# Patient Record
Sex: Male | Born: 1950 | Race: Black or African American | Hispanic: No | Smoking: Current every day smoker
Health system: Southern US, Community
[De-identification: ages and names within clinical notes are randomized; demographics above are authoritative.]

## PROBLEM LIST (undated history)

## (undated) DIAGNOSIS — R0600 Dyspnea, unspecified: Secondary | ICD-10-CM

## (undated) DIAGNOSIS — J189 Pneumonia, unspecified organism: Secondary | ICD-10-CM

## (undated) DIAGNOSIS — J45909 Unspecified asthma, uncomplicated: Secondary | ICD-10-CM

## (undated) DIAGNOSIS — F1010 Alcohol abuse, uncomplicated: Secondary | ICD-10-CM

## (undated) DIAGNOSIS — J449 Chronic obstructive pulmonary disease, unspecified: Secondary | ICD-10-CM

## (undated) DIAGNOSIS — J302 Other seasonal allergic rhinitis: Secondary | ICD-10-CM

## (undated) DIAGNOSIS — R569 Unspecified convulsions: Secondary | ICD-10-CM

## (undated) HISTORY — PX: EYE SURGERY: SHX253

---

## 2005-07-17 ENCOUNTER — Ambulatory Visit: Payer: Self-pay | Admitting: Family Medicine

## 2005-07-29 ENCOUNTER — Ambulatory Visit: Payer: Self-pay | Admitting: Internal Medicine

## 2006-03-28 IMAGING — CT CT CHEST W/ CM
1 series · 15 of 33 positions shown, 19 images · non-contrast
Comparison: none

REASON FOR EXAM: ABN CXR 07-17-05 showed thickening of lung markings
COMMENTS:

[Series 2: soft tissue · axial · 0.62mm/px · z∈[-170,+135]mm · 15 of 73 slices shown, 19 images]
[im 6/73  mediastinal]
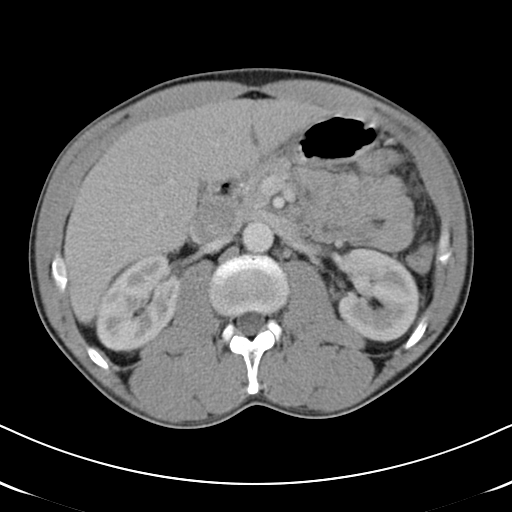
[im 6/73  lung]
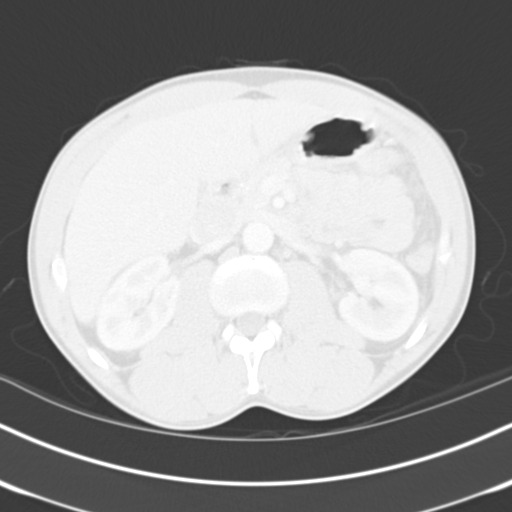
[im 11/73  lung]
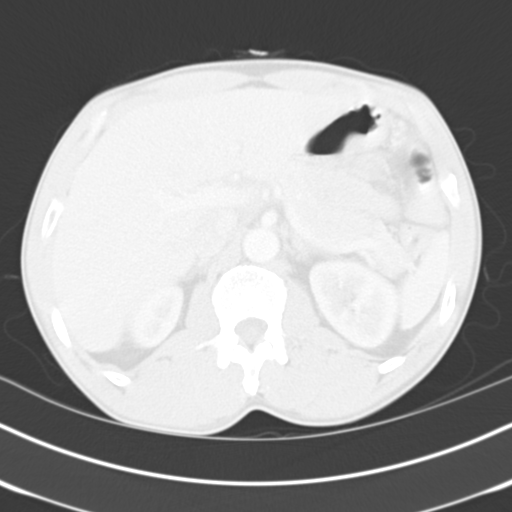
[im 15/73  lung]
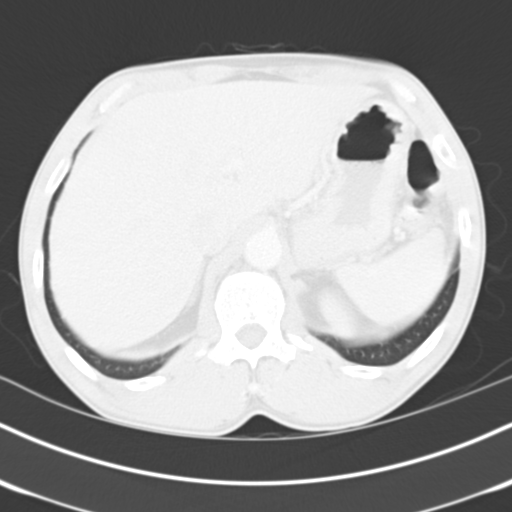
[im 19/73  lung]
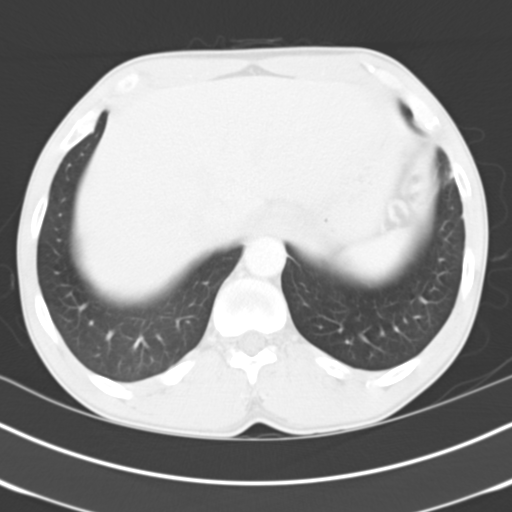
[im 25/73  mediastinal]
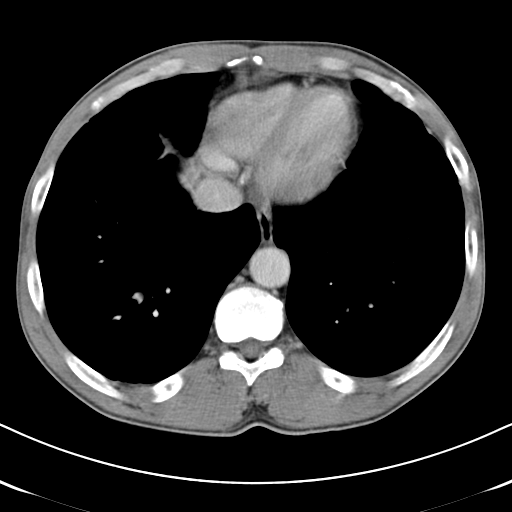
[im 25/73  lung]
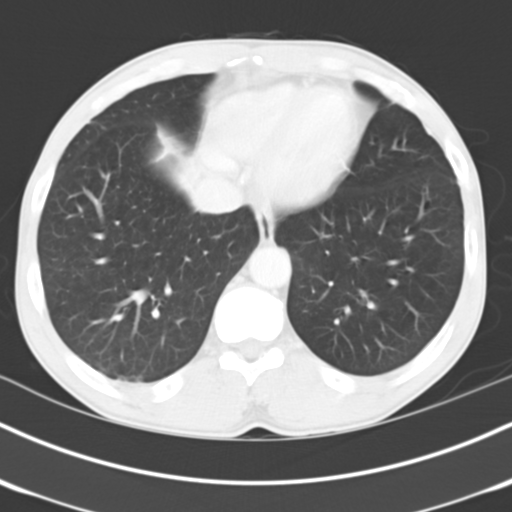
[im 29/73  lung]
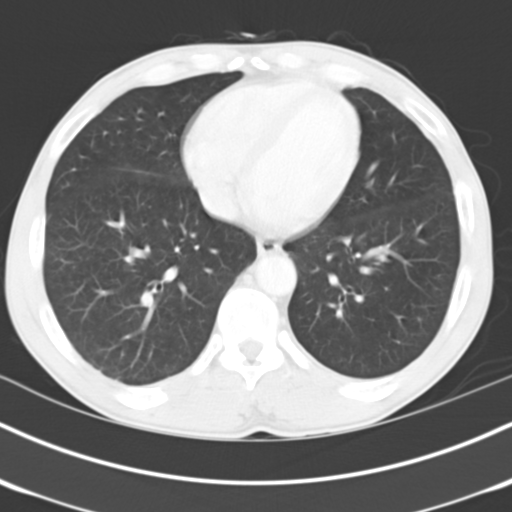
[im 33/73  lung]
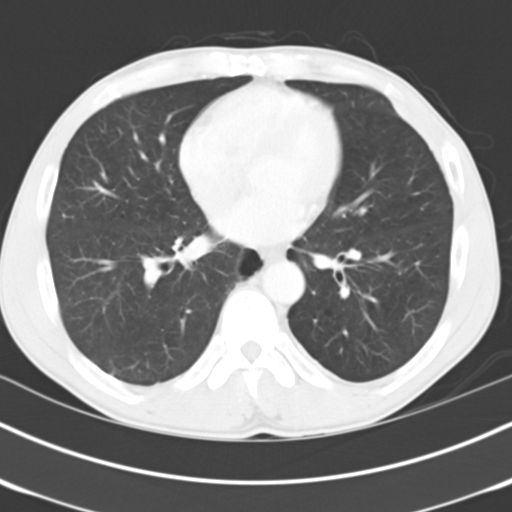
[im 38/73  lung]
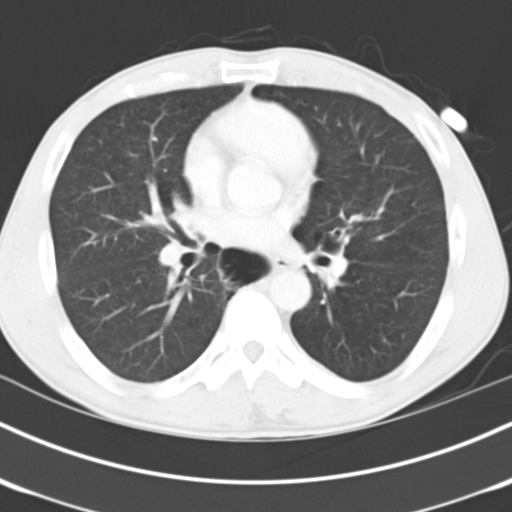
[im 41/73  mediastinal]
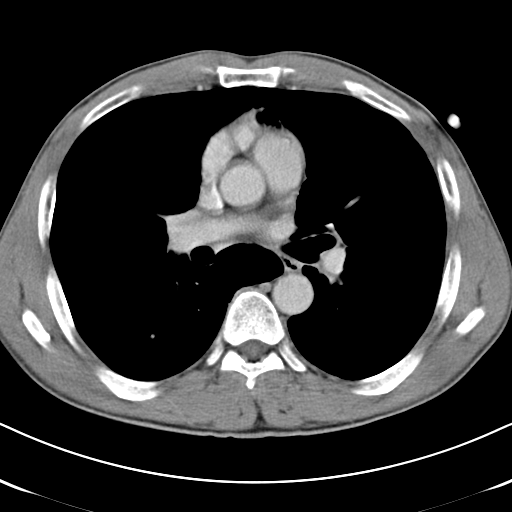
[im 41/73  lung]
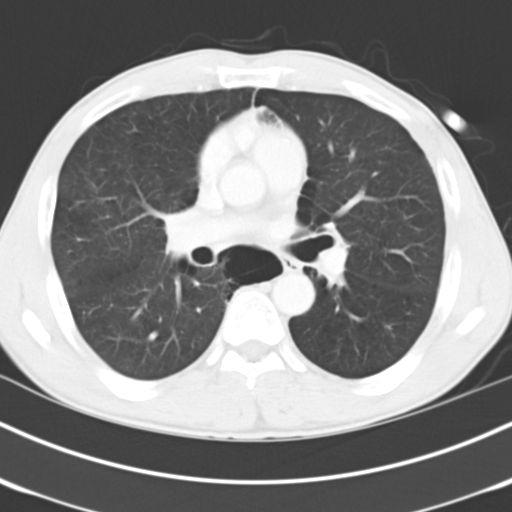
[im 44/73  lung]
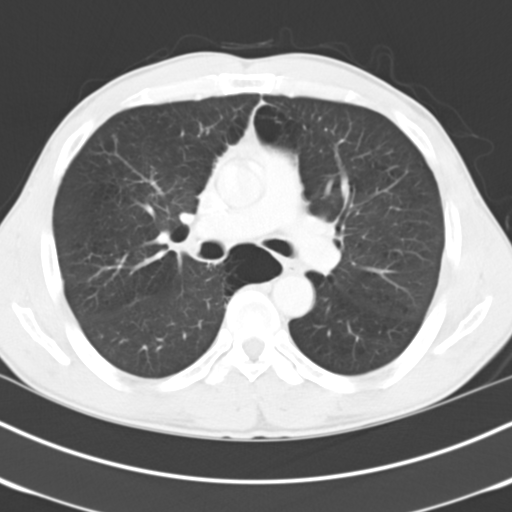
[im 49/73  lung]
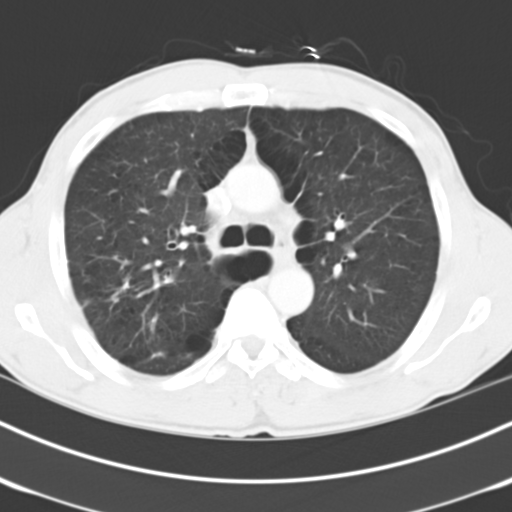
[im 54/73  lung]
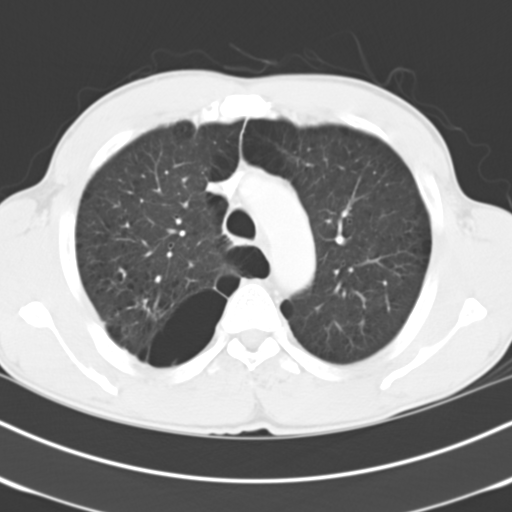
[im 58/73  mediastinal]
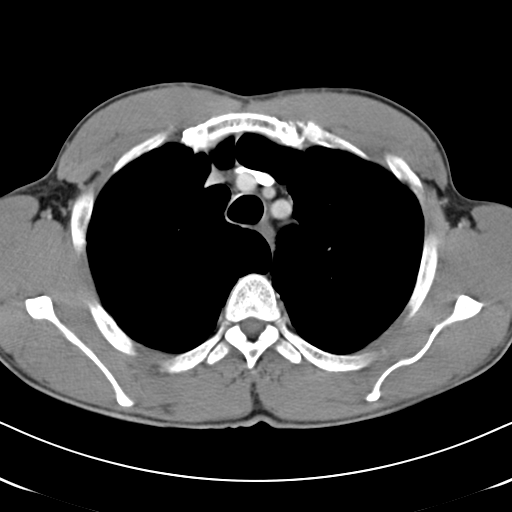
[im 58/73  lung]
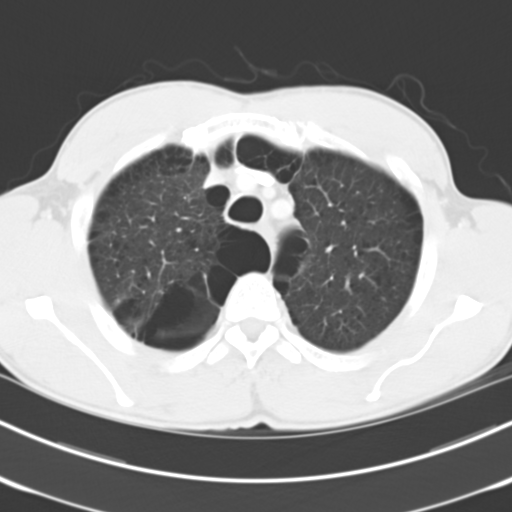
[im 62/73  lung]
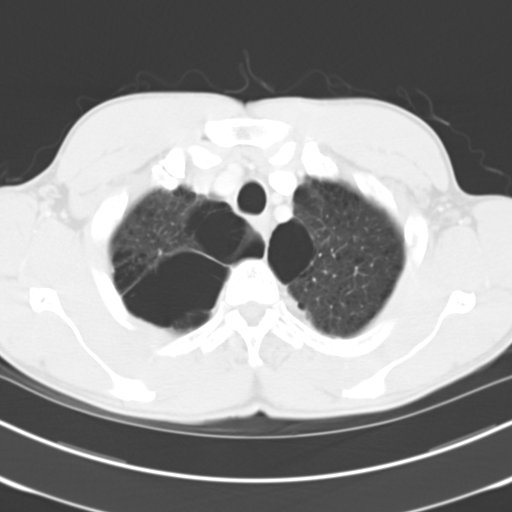
[im 67/73  lung]
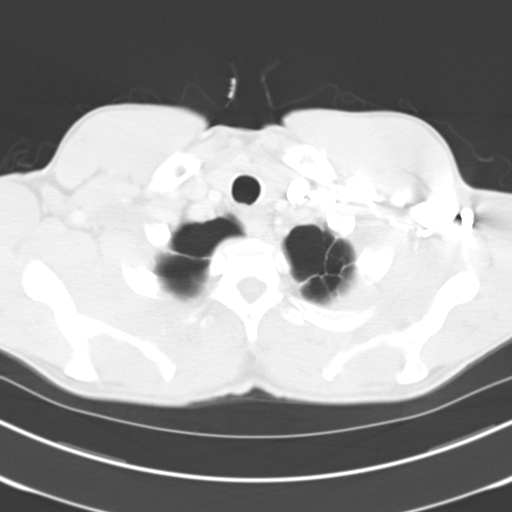

[15 of 33 positions shown; findings below may reference images not displayed]

PROCEDURE:     CT  - CT CHEST WITH CONTRAST  - July 29, 2005  [DATE]

RESULT:     5 mm helical cuts were performed through the chest with 50 ccs
of Isovue 370 contrast. A recent chest x-ray showed some thickening in the
RIGHT apex along with extensive underlying COPD.  Today's examination with
soft tissue windows shows nonspecific pleural thickening in the apices.
There is no suspicious axillary, mediastinal or perihilar mass or
adenopathy.  No pleural or pericardial effusions are present. Limited cuts
through the upper abdomen do not show an obvious solid organ abnormality.
The lung windows show extensive underlying COPD with emphysematous bullous
formation in the apices. There are some interstitial fibrotic changes in the
apex of the RIGHT lung, which I suspect represented the increased lung
markings. There is no obvious spiculated mass, nodule or pneumonia. The bony
structures show degenerative change.
IMPRESSION: 1)Severe underlying COPD with emphysematous bulla in the apices. There is
also evidence of interstitial fibrotic change particularly in the posterior
aspect of the RIGHT upper lobe. I suspect these interstitial changes are
responsible for the increased lung markings noted on a recent chest x-ray.
There is no evidence of a suspicious spiculated mass, nodule or pneumonia.

2)No suspicious hilar, mediastinal or axillary adenopathy.

3)Extensive degenerative bony changes.

## 2007-07-30 ENCOUNTER — Other Ambulatory Visit: Payer: Self-pay

## 2007-07-30 ENCOUNTER — Emergency Department: Payer: Self-pay | Admitting: Emergency Medicine

## 2010-09-17 ENCOUNTER — Ambulatory Visit: Payer: Self-pay | Admitting: Ophthalmology

## 2014-05-01 ENCOUNTER — Emergency Department: Payer: Self-pay | Admitting: Emergency Medicine

## 2014-05-01 LAB — DRUG SCREEN, URINE

## 2014-05-01 LAB — URINALYSIS, COMPLETE
BILIRUBIN, UR: NEGATIVE
Bacteria: NONE SEEN
Glucose,UR: >=500 mg/dL (ref 0–75)
KETONE: NEGATIVE
Leukocyte Esterase: NEGATIVE
Nitrite: NEGATIVE
Ph: 6 (ref 4.5–8.0)
Protein: NEGATIVE
Specific Gravity: 1.003 (ref 1.003–1.030)
Squamous Epithelial: NONE SEEN
WBC UR: NONE SEEN /HPF (ref 0–5)

## 2014-05-01 LAB — COMPREHENSIVE METABOLIC PANEL
ALBUMIN: 3.5 g/dL (ref 3.4–5.0)
ALT: 22 U/L (ref 12–78)
ANION GAP: 11 (ref 7–16)
Alkaline Phosphatase: 107 U/L
BUN: 15 mg/dL (ref 7–18)
Bilirubin,Total: 0.6 mg/dL (ref 0.2–1.0)
CO2: 23 mmol/L (ref 21–32)
Calcium, Total: 8.9 mg/dL (ref 8.5–10.1)
Chloride: 98 mmol/L (ref 98–107)
Creatinine: 0.89 mg/dL (ref 0.60–1.30)
Glucose: 224 mg/dL — ABNORMAL HIGH (ref 65–99)
Osmolality: 272 (ref 275–301)
POTASSIUM: 4.2 mmol/L (ref 3.5–5.1)
SGOT(AST): 48 U/L — ABNORMAL HIGH (ref 15–37)
Sodium: 132 mmol/L — ABNORMAL LOW (ref 136–145)
Total Protein: 7.1 g/dL (ref 6.4–8.2)

## 2014-05-01 LAB — CBC
HCT: 43.3 % (ref 40.0–52.0)
HGB: 14.4 g/dL (ref 13.0–18.0)
MCH: 29.4 pg (ref 26.0–34.0)
MCHC: 33.3 g/dL (ref 32.0–36.0)
MCV: 88 fL (ref 80–100)
Platelet: 160 10*3/uL (ref 150–440)
RBC: 4.9 10*6/uL (ref 4.40–5.90)
RDW: 15.1 % — AB (ref 11.5–14.5)
WBC: 6.4 10*3/uL (ref 3.8–10.6)

## 2014-05-01 LAB — CK TOTAL AND CKMB (NOT AT ARMC)
CK, TOTAL: 296 U/L
CK-MB: 5.1 ng/mL — ABNORMAL HIGH (ref 0.5–3.6)

## 2014-05-01 LAB — PROTIME-INR
INR: 1
Prothrombin Time: 13.2 secs (ref 11.5–14.7)

## 2014-05-01 LAB — ETHANOL
Ethanol %: 0.115 % — ABNORMAL HIGH (ref 0.000–0.080)
Ethanol: 115 mg/dL

## 2014-05-01 LAB — TROPONIN I: Troponin-I: <0.02 ng/mL

## 2014-05-01 LAB — AMMONIA: AMMONIA, PLASMA: 26 umol/L (ref 11–32)

## 2014-05-01 LAB — PRO B NATRIURETIC PEPTIDE: B-Type Natriuretic Peptide: 135 pg/mL — ABNORMAL HIGH (ref 0–125)

## 2014-05-01 LAB — LIPASE, BLOOD: Lipase: 106 U/L (ref 73–393)

## 2014-10-25 ENCOUNTER — Emergency Department: Payer: Self-pay | Admitting: Emergency Medicine

## 2014-10-25 LAB — CBC
HCT: 46.7 % (ref 40.0–52.0)
HGB: 15.7 g/dL (ref 13.0–18.0)
MCH: 31.2 pg (ref 26.0–34.0)
MCHC: 33.6 g/dL (ref 32.0–36.0)
MCV: 93 fL (ref 80–100)
PLATELETS: 205 10*3/uL (ref 150–440)
RBC: 5.03 10*6/uL (ref 4.40–5.90)
RDW: 13 % (ref 11.5–14.5)
WBC: 7.8 10*3/uL (ref 3.8–10.6)

## 2014-10-25 LAB — URINALYSIS, COMPLETE
BACTERIA: NONE SEEN
BLOOD: NEGATIVE
Bilirubin,UR: NEGATIVE
Glucose,UR: NEGATIVE mg/dL (ref 0–75)
Ketone: NEGATIVE
Leukocyte Esterase: NEGATIVE
Nitrite: NEGATIVE
PH: 7 (ref 4.5–8.0)
Protein: 30
SPECIFIC GRAVITY: 1.015 (ref 1.003–1.030)
WBC UR: NONE SEEN /HPF (ref 0–5)

## 2014-10-25 LAB — BASIC METABOLIC PANEL
ANION GAP: 9 (ref 7–16)
BUN: 7 mg/dL (ref 7–18)
CHLORIDE: 100 mmol/L (ref 98–107)
CREATININE: 0.83 mg/dL (ref 0.60–1.30)
Calcium, Total: 9.3 mg/dL (ref 8.5–10.1)
Co2: 30 mmol/L (ref 21–32)
EGFR (Non-African Amer.): >60
Glucose: 78 mg/dL (ref 65–99)
Osmolality: 274 (ref 275–301)
Potassium: 3.9 mmol/L (ref 3.5–5.1)
Sodium: 139 mmol/L (ref 136–145)

## 2014-10-25 LAB — TROPONIN I

## 2015-09-17 ENCOUNTER — Encounter: Payer: Self-pay | Admitting: Emergency Medicine

## 2015-09-17 ENCOUNTER — Emergency Department
Admission: EM | Admit: 2015-09-17 | Discharge: 2015-09-17 | Disposition: A | Discharge status: Home / Self Care | Payer: BLUE CROSS/BLUE SHIELD | Attending: Student | Admitting: Student

## 2015-09-17 ENCOUNTER — Emergency Department: Payer: BLUE CROSS/BLUE SHIELD

## 2015-09-17 DIAGNOSIS — M25362 Other instability, left knee: Secondary | ICD-10-CM | POA: Insufficient documentation

## 2015-09-17 DIAGNOSIS — M25562 Pain in left knee: Secondary | ICD-10-CM | POA: Diagnosis present

## 2015-09-17 DIAGNOSIS — Z720 Tobacco use: Secondary | ICD-10-CM | POA: Insufficient documentation

## 2015-09-17 NOTE — ED Notes (Signed)
Patient reports "Left Leg will will give away while I am standing on and off" X1 year.

## 2015-09-17 NOTE — ED Notes (Signed)
States he has been having weakness to left knee states knee has been buckling  For 2 years

## 2015-09-17 NOTE — ED Notes (Signed)
Denies any injury but states left knee has been buckling under him. No swelling noted

## 2015-09-17 NOTE — ED Provider Notes (Signed)
[facility] Emergency Department Provider Note  ____________________________________________  Time seen: Approximately 1:35 PM  I have reviewed the triage vital signs and the nursing notes.   HISTORY  Chief Complaint Knee Problem    HPI Andre Rios is a 64 y.o. male who presents for evaluation of left knee weakness. Patient states his knees starts buckling under him and feels like he is given a fall off balance on and off for about a year. Patient denies any pain at this time denies any trauma just some weakness in his left knee.   History reviewed. No pertinent past medical history.  There are no active problems to display for this patient.   History reviewed. No pertinent past surgical history.  No current outpatient prescriptions on file.  Allergies Review of patient's allergies indicates no known allergies.  No family history on file.  Social History Social History  Substance Use Topics  . Smoking status: Current Every Day Smoker  . Smokeless tobacco: None  . Alcohol Use: Yes    Review of Systems Constitutional: No fever/chills Eyes: No visual changes. ENT: No sore throat. Cardiovascular: Denies chest pain. Respiratory: Denies shortness of breath. Gastrointestinal: No abdominal pain.  No nausea, no vomiting.  No diarrhea.  No constipation. Genitourinary: Negative for dysuria. Musculoskeletal: Left knee weakness Skin: Negative for rash. Neurological: Negative for headaches, focal weakness or numbness.  10-point ROS otherwise negative.  ____________________________________________   PHYSICAL EXAM:  VITAL SIGNS: ED Triage Vitals  Enc Vitals Group     BP 09/17/15 1321 168/90 mmHg     Pulse Rate 09/17/15 1321 104     Resp --      Temp 09/17/15 1321 98.3 F (36.8 C)     Temp Source 09/17/15 1321 Oral     SpO2 09/17/15 1321 96 %     Weight 09/17/15 1327 120 lb (54.432 kg)     Height 09/17/15 1327 5\' 7"  (1.702 m)     Head  Cir --      Peak Flow --      Pain Score --      Pain Loc --      Pain Edu? --      Excl. in [place]? --     Constitutional: Alert and oriented. Well appearing and in no acute distress. Cardiovascular: Normal rate, regular rhythm. Grossly normal heart sounds.  Good peripheral circulation. Respiratory: Normal respiratory effort.  No retractions. Lungs CTAB. Musculoskeletal: No lower extremity tenderness nor edema.  No joint effusions. Neurologic:  Normal speech and language. No gross focal neurologic deficits are appreciated. No gait instability. Skin:  Skin is warm, dry and intact. No rash noted. Psychiatric: Mood and affect are normal. Speech and behavior are normal.  ____________________________________________   LABS (all labs ordered are listed, but only abnormal results are displayed)  Labs Reviewed - No data to display  RADIOLOGY  No bony or structural changes.  ____________________________________________   PROCEDURES  Procedure(s) performed: None  Critical Care performed: No  ____________________________________________   INITIAL IMPRESSION / ASSESSMENT AND PLAN / ED COURSE  Pertinent labs & imaging results that were available during my care of the patient were reviewed by me and considered in my medical decision making (see chart for details).  Left knee weakness. Knee immobilizer provided patient to take over-the-counter ibuprofen as needed. Follow-up with PCP for possible MRI.  Patient voices no other emergency medical complaints at this time. ____________________________________________   FINAL CLINICAL IMPRESSION(S) / ED DIAGNOSES  Final diagnoses:  Knee instability, left      Arlyss Repress, PA-C 09/17/15 1442  Joanne Gavel, MD 09/17/15 2237

## 2016-07-30 ENCOUNTER — Emergency Department: Payer: Self-pay

## 2016-07-30 ENCOUNTER — Emergency Department
Admission: EM | Admit: 2016-07-30 | Discharge: 2016-07-30 | Disposition: A | Discharge status: Home / Self Care | Payer: Self-pay | Attending: Emergency Medicine | Admitting: Emergency Medicine

## 2016-07-30 DIAGNOSIS — F1010 Alcohol abuse, uncomplicated: Secondary | ICD-10-CM | POA: Insufficient documentation

## 2016-07-30 DIAGNOSIS — F17200 Nicotine dependence, unspecified, uncomplicated: Secondary | ICD-10-CM | POA: Insufficient documentation

## 2016-07-30 DIAGNOSIS — J449 Chronic obstructive pulmonary disease, unspecified: Secondary | ICD-10-CM

## 2016-07-30 DIAGNOSIS — J40 Bronchitis, not specified as acute or chronic: Secondary | ICD-10-CM | POA: Insufficient documentation

## 2016-07-30 DIAGNOSIS — J441 Chronic obstructive pulmonary disease with (acute) exacerbation: Secondary | ICD-10-CM | POA: Insufficient documentation

## 2016-07-30 HISTORY — DX: Chronic obstructive pulmonary disease, unspecified: J44.9

## 2016-07-30 LAB — COMPREHENSIVE METABOLIC PANEL
ALT: 75 U/L — ABNORMAL HIGH (ref 17–63)
ANION GAP: 16 — AB (ref 5–15)
AST: 180 U/L — ABNORMAL HIGH (ref 15–41)
Albumin: 4.3 g/dL (ref 3.5–5.0)
Alkaline Phosphatase: 101 U/L (ref 38–126)
BUN: 8 mg/dL (ref 6–20)
CALCIUM: 9.6 mg/dL (ref 8.9–10.3)
CHLORIDE: 104 mmol/L (ref 101–111)
CO2: 20 mmol/L — AB (ref 22–32)
Creatinine, Ser: 0.76 mg/dL (ref 0.61–1.24)
GFR calc non Af Amer: >60 mL/min (ref >60)
Glucose, Bld: 95 mg/dL (ref 65–99)
POTASSIUM: 4.1 mmol/L (ref 3.5–5.1)
SODIUM: 140 mmol/L (ref 135–145)
Total Bilirubin: 0.6 mg/dL (ref 0.3–1.2)
Total Protein: 7.7 g/dL (ref 6.5–8.1)

## 2016-07-30 LAB — CBC WITH DIFFERENTIAL/PLATELET
Basophils Absolute: 0 10*3/uL (ref 0–0.1)
Basophils Relative: 1 %
EOS ABS: 0 10*3/uL (ref 0–0.7)
Eosinophils Relative: 0 %
HEMATOCRIT: 41.5 % (ref 40.0–52.0)
Hemoglobin: 14.3 g/dL (ref 13.0–18.0)
LYMPHS ABS: 0.5 10*3/uL — AB (ref 1.0–3.6)
LYMPHS PCT: 8 %
MCH: 31.1 pg (ref 26.0–34.0)
MCHC: 34.4 g/dL (ref 32.0–36.0)
MCV: 90.3 fL (ref 80.0–100.0)
Monocytes Absolute: 0.4 10*3/uL (ref 0.2–1.0)
Monocytes Relative: 6 %
NEUTROS PCT: 85 %
Neutro Abs: 5.8 10*3/uL (ref 1.4–6.5)
Platelets: 123 10*3/uL — ABNORMAL LOW (ref 150–440)
RBC: 4.6 MIL/uL (ref 4.40–5.90)
RDW: 13.7 % (ref 11.5–14.5)
WBC: 6.8 10*3/uL (ref 3.8–10.6)

## 2016-07-30 LAB — TROPONIN I: Troponin I: <0.03 ng/mL (ref <0.03)

## 2016-07-30 LAB — ETHANOL: Alcohol, Ethyl (B): 93 mg/dL — ABNORMAL HIGH (ref <5)

## 2016-07-30 MED ORDER — PREDNISONE 20 MG PO TABS: 60.0000 mg | ORAL_TABLET | Freq: Every day | ORAL | 0 refills | Status: DC

## 2016-07-30 MED ORDER — SODIUM CHLORIDE 0.9 % IV BOLUS (SEPSIS)
500.0000 mL | Freq: Once | INTRAVENOUS | Status: AC
  Administered 2016-07-30: 500 mL via INTRAVENOUS

## 2016-07-30 MED ORDER — ALBUTEROL SULFATE (2.5 MG/3ML) 0.083% IN NEBU
5.0000 mg | INHALATION_SOLUTION | Freq: Once | RESPIRATORY_TRACT | Status: AC
  Administered 2016-07-30: 5 mg via RESPIRATORY_TRACT
  Filled 2016-07-30: qty 6

## 2016-07-30 MED ORDER — THIAMINE HCL 100 MG/ML IJ SOLN
100.0000 mg | Freq: Every day | INTRAMUSCULAR | Status: DC
Start: 2016-07-30 — End: 2016-07-30
  Administered 2016-07-30: 100 mg via INTRAVENOUS
  Filled 2016-07-30: qty 2

## 2016-07-30 MED ORDER — PREDNISONE 20 MG PO TABS
60.0000 mg | ORAL_TABLET | Freq: Once | ORAL | Status: AC
  Administered 2016-07-30: 60 mg via ORAL
  Filled 2016-07-30: qty 3

## 2016-07-30 MED ORDER — DOXYCYCLINE HYCLATE 100 MG PO CAPS: 100.0000 mg | ORAL_CAPSULE | Freq: Two times a day (BID) | ORAL | 0 refills | Status: DC

## 2016-07-30 MED ORDER — ALBUTEROL SULFATE HFA 108 (90 BASE) MCG/ACT IN AERS: 2.0000 | INHALATION_SPRAY | Freq: Four times a day (QID) | RESPIRATORY_TRACT | 2 refills | Status: DC | PRN

## 2016-07-30 MED ORDER — SODIUM CHLORIDE 0.9 % IV SOLN
1.0000 mg | Freq: Once | INTRAVENOUS | Status: AC
  Administered 2016-07-30: 1 mg via INTRAVENOUS
  Filled 2016-07-30: qty 0.2

## 2016-07-30 MED ORDER — LORAZEPAM 2 MG/ML IJ SOLN
1.0000 mg | Freq: Once | INTRAMUSCULAR | Status: AC
  Administered 2016-07-30: 1 mg via INTRAVENOUS
  Filled 2016-07-30: qty 1

## 2016-07-30 MED ORDER — IPRATROPIUM BROMIDE 0.02 % IN SOLN
0.5000 mg | Freq: Once | RESPIRATORY_TRACT | Status: AC
  Administered 2016-07-30: 0.5 mg via RESPIRATORY_TRACT
  Filled 2016-07-30: qty 2.5

## 2016-07-30 NOTE — ED Provider Notes (Addendum)
[facility] Emergency Department Provider Note  ____________________________________________   I have reviewed the triage vital signs and the nursing notes.   HISTORY  Chief Complaint Dizziness and Shortness of Breath    HPI Andre Rios is a 65 y.o. male presents today complaining of"a summer cold" patient states he's been coughing for last 3 days. Productive. Patient does have a history of tobacco abuse. He smokes. He is not on home oxygen. Patient also drinks every day. He did have alcohol last night although he states it maybe slightly less than normal. When he woke up this morning, he felt a little bit lightheaded. He did not have any "balance issues" aside from that. Denies any melena bright red blood per rectum or vomiting. He states that he does not have any focal numbness or weakness or headache. He states that when he coughs he feels a little bit lightheaded. He has not had a fever but he did feel somewhat warm he states. He denies history of alcohol withdrawal. He also complains of a slight sore throat and rhinorrhea      Past Medical History:  Diagnosis Date  . COPD (chronic obstructive pulmonary disease) (HCC)     There are no active problems to display for this patient.   Past Surgical History:  Procedure Laterality Date  . EYE SURGERY        Allergies Review of patient's allergies indicates no known allergies.  No family history on file.  Social History Social History  Substance Use Topics  . Smoking status: Current Every Day Smoker  . Smokeless tobacco: Never Used  . Alcohol use 3.0 oz/week    5 Cans of beer per week    Review of Systems }Constitutional: No fever/chills Eyes: No visual changes. ENT: Positive slight sore throat. No stiff neck no neck pain Cardiovascular: Positive cough Respiratory: Denies shortness of breath. Gastrointestinal:   no vomiting.  No diarrhea.  No constipation. Genitourinary: Negative for  dysuria. Musculoskeletal: Negative lower extremity swelling Skin: Negative for rash. Neurological: Negative for severe headaches, focal weakness or numbness. 10-point ROS otherwise negative.  ____________________________________________   PHYSICAL EXAM:  VITAL SIGNS: ED Triage Vitals [07/30/16 0830]  Enc Vitals Group     BP (!) 141/78     Pulse Rate (!) 107     Resp (!) 24     Temp 97.6 F (36.4 C)     Temp Source Oral     SpO2 99 %     Weight 123 lb (55.8 kg)     Height 5\' 6"  (1.676 m)     Head Circumference      Peak Flow      Pain Score 0     Pain Loc      Pain Edu?      Excl. in [place]?     Constitutional: Alert and oriented. Well appearing and in no acute distress. Eyes: Conjunctivae are normal. PERRL. EOMI. Head: Atraumatic. Nose: No congestion/rhinnorhea. Mouth/Throat: Mucous membranes are moist.  Oropharynx non-erythematous. Neck: No stridor.   Nontender with no meningismus Cardiovascular: Normal rate, regular rhythm. Grossly normal heart sounds.  Good peripheral circulation. Respiratory: Normal respiratory effort.  No retractions. Occasional slight wheeze noted. Abdominal: Soft and nontender. No distention. No guarding no rebound Back:  There is no focal tenderness or step off.  there is no midline tenderness there are no lesions noted. there is no CVA tenderness Musculoskeletal: No lower extremity tenderness, no upper extremity tenderness. No joint effusions,  no DVT signs strong distal pulses no edema Neurologic:  Cranial nerves II through XII are grossly intact 5 out of 5 strength bilateral upper and lower extremity. Finger to nose within normal limits heel to shin within normal limits, speech is normal with no word finding difficulty or dysarthria, reflexes symmetric, pupils are equally round and reactive to light, there is no pronator drift, sensation is normal, vision is intact to confrontation, gait is deferred, there is no nystagmus, normal neurologic exam Skin:   Skin is warm, dry and intact. No rash noted. Psychiatric: Mood and affect are normal. Speech and behavior are normal.  ____________________________________________   LABS (all labs ordered are listed, but only abnormal results are displayed)  Labs Reviewed  COMPREHENSIVE METABOLIC PANEL  ETHANOL  CBC WITH DIFFERENTIAL/PLATELET  TROPONIN I   ____________________________________________  EKG  I personally interpreted any EKGs ordered by me or triage Normal sinus rhythm rate 93 bpm, repolarization likely in the [place] leads, no acute ST elevation otherwise, normal axis ____________________________________________  RADIOLOGY  I reviewed any imaging ordered by me or triage that were performed during my shift and, if possible, patient and/or family made aware of any abnormal findings. ____________________________________________   PROCEDURES  Procedure(s) performed: None  Procedures  Critical Care performed: None  ____________________________________________   INITIAL IMPRESSION / ASSESSMENT AND PLAN / ED COURSE  Pertinent labs & imaging results that were available during my care of the patient were reviewed by me and considered in my medical decision making (see chart for details).  Patient complaining of a "summer cold" in the context of COPD and cough. We will give him thiamine and folate as he is a daily drinker given Ativan as a precaution given that he'll be here for some time and I do not wish to withdraw especially since he had less alcohol and normal last night, however he does not appear to be in withdrawal at this time. There is no evidence of CVA. His "balance issues" was a feeling of lightheadedness after coughing this morning. His neurologic exam is normal I do not think he requires a CT at this time. No evidence of CVA at this moment. Did not fall or hit his head. We will do chest x-ray basic blood work and reassess. I will give him a breathing treatment for  his slight wheeze.  Clinical Course   ______  ----------------------------------------- 12:11 PM on 07/30/2016 -----------------------------------------  Lungs are clear at this time patient feels much better. His "one beer" last night was enough to get an alcohol level of 93 this morning. He denies striking on call today. No evidence of this is acute ischemic process. There is no evidence of this is an acute coronary syndrome. Patient has URI symptoms with cough and wheeze. We will send him home on doxycycline, we've advised alcohol cessation which she is not interested in having any further intervention on today. His sats are good and he feels okay. Return precautions and follow-up given and understood. Patient not clinically intoxicated at this time.  ______________________________________   FINAL CLINICAL IMPRESSION(S) / ED DIAGNOSES  Final diagnoses:  None      This chart was dictated using voice recognition software.  Despite best efforts to proofread,  errors can occur which can change meaning.      Schuyler Amor, MD 07/30/16 [place], MD 07/30/16 [phone]

## 2016-07-30 NOTE — ED Triage Notes (Signed)
Pt c/o increased SOB over the past couple of day with cough and congestion.. Pt also c/o increased dizziness since seen here last time..states he is having trouble with his balance.. Pt was dropped off by his wife.

## 2016-08-06 ENCOUNTER — Emergency Department
Admission: EM | Admit: 2016-08-06 | Discharge: 2016-08-06 | Disposition: A | Discharge status: Home / Self Care | Payer: BLUE CROSS/BLUE SHIELD | Attending: Emergency Medicine | Admitting: Emergency Medicine

## 2016-08-06 ENCOUNTER — Encounter: Payer: Self-pay | Admitting: *Deleted

## 2016-08-06 DIAGNOSIS — T380X5A Adverse effect of glucocorticoids and synthetic analogues, initial encounter: Secondary | ICD-10-CM | POA: Insufficient documentation

## 2016-08-06 DIAGNOSIS — F19921 Other psychoactive substance use, unspecified with intoxication with delirium: Secondary | ICD-10-CM | POA: Insufficient documentation

## 2016-08-06 DIAGNOSIS — T50905A Adverse effect of unspecified drugs, medicaments and biological substances, initial encounter: Secondary | ICD-10-CM

## 2016-08-06 DIAGNOSIS — F17200 Nicotine dependence, unspecified, uncomplicated: Secondary | ICD-10-CM | POA: Insufficient documentation

## 2016-08-06 DIAGNOSIS — J449 Chronic obstructive pulmonary disease, unspecified: Secondary | ICD-10-CM | POA: Insufficient documentation

## 2016-08-06 NOTE — ED Provider Notes (Signed)
[facility] Emergency Department Provider Note  ____________________________________________  Time seen: Approximately 4:49 PM  I have reviewed the triage vital signs and the nursing notes.   HISTORY  Chief Complaint Hallucinations    HPI Andre Rios is a 65 y.o. male who comes to the ED complaining of an episode earlier today where he felt that he was having visual hallucinations. He thought that there was a swarm of people outside of his room, maybe up to 150 people. He could hear them talking, saying things about needing to clean the walls in the windows. Currently he feels alert and oriented and denies any hallucinations. No SI HI. Ever had anything like this before. No history of schizophrenia or bipolar, does not take any sort of neuroleptics or anti- psychotics.  He has been taking prednisone and doxycycline for the last 2 days due to COPD exacerbation. He shows me the bottle, and he is taking 60 mg of prednisone daily yesterday and the day before. He has not taken it today yet. Otherwise eating and drinking normally and no other complaints. His breathing has improved over the last 48 hours.  No head trauma. No headache or vision changes or numbness tingling or weakness   Past Medical History:  Diagnosis Date  . COPD (chronic obstructive pulmonary disease) (HCC)      There are no active problems to display for this patient.    Past Surgical History:  Procedure Laterality Date  . EYE SURGERY       Prior to Admission medications   Medication Sig Start Date End Date Taking? Authorizing Provider  albuterol (PROVENTIL HFA;VENTOLIN HFA) 108 (90 Base) MCG/ACT inhaler Inhale 2 puffs into the lungs every 6 (six) hours as needed for wheezing or shortness of breath. 07/30/16   Schuyler Amor, MD  doxycycline (VIBRAMYCIN) 100 MG capsule Take 1 capsule (100 mg total) by mouth 2 (two) times daily. 07/30/16   Schuyler Amor, MD     Allergies Review of  patient's allergies indicates no known allergies.   History reviewed. No pertinent family history.  Social History Social History  Substance Use Topics  . Smoking status: Current Every Day Smoker  . Smokeless tobacco: Never Used  . Alcohol use 3.0 oz/week    5 Cans of beer per week    Review of Systems  Constitutional:   No fever or chills.  ENT:   No sore throat. No rhinorrhea. Cardiovascular:   No chest pain. Respiratory:   No dyspnea , Positive occasional nonproductive cough. Gastrointestinal:   Negative for abdominal pain, vomiting and diarrhea.  Genitourinary:   Negative for dysuria or difficulty urinating. Musculoskeletal:   Negative for focal pain or swelling Neurological:   Negative for headaches. 10-point ROS otherwise negative.  ____________________________________________   PHYSICAL EXAM:  VITAL SIGNS: ED Triage Vitals  Enc Vitals Group     BP 08/06/16 1349 132/86     Pulse Rate 08/06/16 1349 88     Resp 08/06/16 1349 18     Temp 08/06/16 1349 98.4 F (36.9 C)     Temp Source 08/06/16 1349 Oral     SpO2 08/06/16 1349 98 %     Weight 08/06/16 1350 123 lb (55.8 kg)     Height 08/06/16 1350 5\' 6"  (1.676 m)     Head Circumference --      Peak Flow --      Pain Score 08/06/16 1350 0     Pain Loc --  Pain Edu? --      Excl. in [place]? --     Vital signs reviewed, nursing assessments reviewed.   Constitutional:   Alert and oriented. Well appearing and in no distress. Eyes:   No scleral icterus. No conjunctival pallor. PERRL. EOMI.  No nystagmus. ENT   Head:   Normocephalic and atraumatic.   Nose:   No congestion/rhinnorhea. No septal hematoma   Mouth/Throat:   MMM, no pharyngeal erythema. No peritonsillar mass.    Neck:   No stridor. No SubQ emphysema. No meningismus. Hematological/Lymphatic/Immunilogical:   No cervical lymphadenopathy. Cardiovascular:   RRR. Symmetric bilateral radial and DP pulses.  No murmurs.  Respiratory:   Normal  respiratory effort without tachypnea nor retractions. Breath sounds are clear and equal bilaterally. No wheezes/rales/rhonchi. Gastrointestinal:   Soft and nontender. Non distended. There is no CVA tenderness.  No rebound, rigidity, or guarding. Genitourinary:   deferred Musculoskeletal:   Nontender with normal range of motion in all extremities. No joint effusions.  No lower extremity tenderness.  No edema. Neurologic:   Normal speech and language.  CN 2-10 normal. Motor grossly intact. No gross focal neurologic deficits are appreciated.  Skin:    Skin is warm, dry and intact. No rash noted.  No petechiae, purpura, or bullae.  ____________________________________________    LABS (pertinent positives/negatives) (all labs ordered are listed, but only abnormal results are displayed) Labs Reviewed - No data to display ____________________________________________   EKG    ____________________________________________    RADIOLOGY    ____________________________________________   PROCEDURES Procedures  ____________________________________________   INITIAL IMPRESSION / ASSESSMENT AND PLAN / ED COURSE  Pertinent labs & imaging results that were available during my care of the patient were reviewed by me and considered in my medical decision making (see chart for details).  Patient presents with an episode of predominantly visual hallucinations. This is in the setting of recently starting high-dose prednisone for COPD exacerbation. His exam is otherwise benign and reassuring. No other acute symptoms. Presentation is highly consistent with adverse drug reaction, medication induced delirium related to the prednisone. This appears to been brief and self-limited, and I expect things to return to normal and an remain under control going forward as long as he doesn't take anymore prednisone. I advised him to stop taking the prednisone and discard the remaining tablets. Discussed  options as far as the best way to discard medication. Low suspicion for meningitis encephalitis psychosis structural brain lesion or intracranial hypertension or sepsis. Patient's very well-appearing, calm and comfortable oriented and will be discharged with family who are present at the bedside with him.     Clinical Course   ____________________________________________   FINAL CLINICAL IMPRESSION(S) / ED DIAGNOSES  Final diagnoses:  Delirium, drug-induced ([place])  Medication side effect, initial encounter       Portions of this note were generated with dragon dictation software. Dictation errors may occur despite best attempts at proofreading.    Carrie Mew, MD 08/06/16 1655

## 2016-08-06 NOTE — ED Triage Notes (Signed)
Pt arrives via EMS from home with hallucinations, states he felt like people were in his room states 150 people at his door, states seeing a herse and they were coming for him, pt was seen in ED last week for dehydration and pneumonia, was given doxycycline and predisone which he has been taking since 8/21, at present pt awake and alert, denies any hallucinations

## 2016-08-07 ENCOUNTER — Emergency Department
Admission: EM | Admit: 2016-08-07 | Discharge: 2016-08-08 | Disposition: A | Discharge status: Home / Self Care | Payer: BLUE CROSS/BLUE SHIELD | Attending: Emergency Medicine | Admitting: Emergency Medicine

## 2016-08-07 ENCOUNTER — Encounter: Payer: Self-pay | Admitting: *Deleted

## 2016-08-07 DIAGNOSIS — F09 Unspecified mental disorder due to known physiological condition: Secondary | ICD-10-CM

## 2016-08-07 DIAGNOSIS — F1027 Alcohol dependence with alcohol-induced persisting dementia: Secondary | ICD-10-CM

## 2016-08-07 DIAGNOSIS — F1010 Alcohol abuse, uncomplicated: Secondary | ICD-10-CM

## 2016-08-07 DIAGNOSIS — Z79899 Other long term (current) drug therapy: Secondary | ICD-10-CM | POA: Insufficient documentation

## 2016-08-07 DIAGNOSIS — J449 Chronic obstructive pulmonary disease, unspecified: Secondary | ICD-10-CM | POA: Insufficient documentation

## 2016-08-07 DIAGNOSIS — F17200 Nicotine dependence, unspecified, uncomplicated: Secondary | ICD-10-CM | POA: Insufficient documentation

## 2016-08-07 DIAGNOSIS — R441 Visual hallucinations: Secondary | ICD-10-CM

## 2016-08-07 DIAGNOSIS — Z046 Encounter for general psychiatric examination, requested by authority: Secondary | ICD-10-CM

## 2016-08-07 DIAGNOSIS — F99 Mental disorder, not otherwise specified: Secondary | ICD-10-CM

## 2016-08-07 DIAGNOSIS — E876 Hypokalemia: Secondary | ICD-10-CM | POA: Insufficient documentation

## 2016-08-07 HISTORY — DX: Other seasonal allergic rhinitis: J30.2

## 2016-08-07 HISTORY — DX: Pneumonia, unspecified organism: J18.9

## 2016-08-07 LAB — COMPREHENSIVE METABOLIC PANEL
ALK PHOS: 101 U/L (ref 38–126)
ALT: 55 U/L (ref 17–63)
ANION GAP: 11 (ref 5–15)
AST: 73 U/L — ABNORMAL HIGH (ref 15–41)
Albumin: 4.2 g/dL (ref 3.5–5.0)
BUN: 23 mg/dL — AB (ref 6–20)
CO2: 26 mmol/L (ref 22–32)
Calcium: 10 mg/dL (ref 8.9–10.3)
Chloride: 103 mmol/L (ref 101–111)
Creatinine, Ser: 1 mg/dL (ref 0.61–1.24)
GFR calc non Af Amer: >60 mL/min (ref >60)
GLUCOSE: 151 mg/dL — AB (ref 65–99)
Potassium: 3 mmol/L — ABNORMAL LOW (ref 3.5–5.1)
SODIUM: 140 mmol/L (ref 135–145)
Total Bilirubin: 0.5 mg/dL (ref 0.3–1.2)
Total Protein: 7.7 g/dL (ref 6.5–8.1)

## 2016-08-07 LAB — URINALYSIS COMPLETE WITH MICROSCOPIC (ARMC ONLY)
BACTERIA UA: NONE SEEN
Bilirubin Urine: NEGATIVE
Glucose, UA: 50 mg/dL — AB
Hgb urine dipstick: NEGATIVE
KETONES UR: NEGATIVE mg/dL
Leukocytes, UA: NEGATIVE
Nitrite: NEGATIVE
PROTEIN: 100 mg/dL — AB
Specific Gravity, Urine: 1.021 (ref 1.005–1.030)
pH: 5 (ref 5.0–8.0)

## 2016-08-07 LAB — CBC WITH DIFFERENTIAL/PLATELET
Basophils Absolute: 0 10*3/uL (ref 0–0.1)
Basophils Relative: 0 %
Eosinophils Absolute: 0 10*3/uL (ref 0–0.7)
Eosinophils Relative: 0 %
HEMATOCRIT: 41.6 % (ref 40.0–52.0)
Hemoglobin: 14.2 g/dL (ref 13.0–18.0)
LYMPHS PCT: 17 %
Lymphs Abs: 1.1 10*3/uL (ref 1.0–3.6)
MCH: 31.2 pg (ref 26.0–34.0)
MCHC: 34.1 g/dL (ref 32.0–36.0)
MCV: 91.5 fL (ref 80.0–100.0)
MONO ABS: 0.9 10*3/uL (ref 0.2–1.0)
MONOS PCT: 14 %
NEUTROS ABS: 4.4 10*3/uL (ref 1.4–6.5)
Neutrophils Relative %: 69 %
Platelets: 137 10*3/uL — ABNORMAL LOW (ref 150–440)
RBC: 4.54 MIL/uL (ref 4.40–5.90)
RDW: 13.4 % (ref 11.5–14.5)
WBC: 6.4 10*3/uL (ref 3.8–10.6)

## 2016-08-07 LAB — ETHANOL: Alcohol, Ethyl (B): <5 mg/dL (ref <5)

## 2016-08-07 LAB — URINE DRUG SCREEN, QUALITATIVE (ARMC ONLY)
AMPHETAMINES, UR SCREEN: NOT DETECTED
BARBITURATES, UR SCREEN: NOT DETECTED
BENZODIAZEPINE, UR SCRN: NOT DETECTED
Cannabinoid 50 Ng, Ur ~~LOC~~: POSITIVE — AB
Cocaine Metabolite,Ur ~~LOC~~: NOT DETECTED
MDMA (Ecstasy)Ur Screen: NOT DETECTED
Methadone Scn, Ur: NOT DETECTED
OPIATE, UR SCREEN: NOT DETECTED
PHENCYCLIDINE (PCP) UR S: NOT DETECTED
Tricyclic, Ur Screen: NOT DETECTED

## 2016-08-07 LAB — POTASSIUM: Potassium: 3.2 mmol/L — ABNORMAL LOW (ref 3.5–5.1)

## 2016-08-07 MED ORDER — POTASSIUM CHLORIDE 10 MEQ/100ML IV SOLN
10.0000 meq | Freq: Once | INTRAVENOUS | Status: DC
  Filled 2016-08-07: qty 100

## 2016-08-07 MED ORDER — POTASSIUM CHLORIDE CRYS ER 20 MEQ PO TBCR
40.0000 meq | EXTENDED_RELEASE_TABLET | Freq: Once | ORAL | Status: AC
  Administered 2016-08-07: 40 meq via ORAL
  Filled 2016-08-07: qty 2

## 2016-08-07 MED ORDER — LORAZEPAM 1 MG PO TABS
1.0000 mg | ORAL_TABLET | Freq: Once | ORAL | Status: AC
  Administered 2016-08-07: 1 mg via ORAL
  Filled 2016-08-07: qty 1

## 2016-08-07 MED ORDER — POTASSIUM CHLORIDE CRYS ER 20 MEQ PO TBCR
10.0000 meq | EXTENDED_RELEASE_TABLET | Freq: Every day | ORAL | Status: DC
  Administered 2016-08-08: 10 meq via ORAL
  Filled 2016-08-07: qty 1

## 2016-08-07 NOTE — ED Notes (Signed)
Pt resting comfortably at this time.

## 2016-08-07 NOTE — ED Triage Notes (Signed)
Per [employer] PD, Patient is IVC for c/o visual hallucinations, "seeing priests." Patient denies SI/HI, states he hears some singing and "things going across the wall."

## 2016-08-07 NOTE — ED Notes (Signed)
Spoke to EDP regarding IV potassium.  States that pt can just do oral potassium at this time and recheck potassium level 1 hour after oral potassium given to patient.

## 2016-08-07 NOTE — ED Notes (Signed)
Patient noted sleeping in room. No complaints, stable, in no acute distress. Q15 minute rounds and monitoring via [employer] to continue.  

## 2016-08-07 NOTE — BH Assessment (Signed)
Tele Assessment Note   Andre Rios is an 65 y.o. male, African American who presents to [facility] ED male with a history of COPD and daily alcohol use who presents to the emergency Department IVC by his wife for hallucinations. Patient was seen here yesterday for similar complaints. Patient has recently been started on prednisone 3 days ago for COPD exacerbation and since yesterday started having visual hallucinations. He is seeing people come in his house from the walls, he is seeing a singing choir in his house and also priests in his living room. Yesterday he was here and was told to stop prednisone. Patient is very confused and unable to provide history at this time so I am not sure if he stopped the prednisone. His wife is not here with him. Patient has no h/o hallucinations. Patient states that he is seeing things in his room, like things crawling into the wall, but is able to distinguish between reality.  Also feels crawling sensation in his skin.  Patient primary concern is of worsening AVH since at or around 1-1.5 months, apparently since he has started taking medication and been treated for pneumonia. Patient states that meds. Made him hot, but is confusing stating that the meds have helped yet has hallucinations. Patient denies any prescription for anti-psychotic or mental health related medication.  Per [facility] note, he has been taking prednisone and doxycycline for the last 2 days due to COPD exacerbation. He shows me the bottle, and he is taking 60 mg of prednisone daily yesterday and the day before. He has not taken it today yet. Otherwise eating and drinking normally and no other complaints. His breathing has improved over the last 48 hours.           Collateral Info: Per MAR noted today, patient's wife on the phone and according to her patient has been having intermittent visual hallucinations over the course of the last 2 years. She reports that he is a heavy drinker and drinks about 10 beers a  day. She is unclear if these hallucinations are brought on by being drunk up or with drawl. She does not remember him having any seizures when he stops drinking. She reports that the visual hallucinations at this time started happening before the patient started on the prednisone. She reports that he has never seen a psychiatrist for details. She does endorse that for the last 2 years has been more forgetful, forgets to pay bills, more confused and has had memory issues. He does not carry a diagnosis of dementia as far she knows.  Patient denies current SI/HI or history of. Patient denies previous hx. Of psychotic symptoms. However, pt acknowledges current problems x 1-1.5 month with AVH, seeing objects, feeling skin crawl, and hearing voices, no command. Patient denies current or hx. Of violence/ HI. Patient denies hx. Of inpatient or outpatient psychiatric care. Patient acknowledges hx. Of alcohol, denies S.A. And states that drinks about 8 beers a day, but has not drank anything for x 3 weeks. URL showed no alcohol levels, but positive for Cannabis. Pt denies any use of marijuana or drugs.     Diagnosis: Unknown Substance Delirium Withdrawal  Past Medical History:  Past Medical History:  Diagnosis Date  . COPD (chronic obstructive pulmonary disease) ([place])   . Pneumonia   . Seasonal allergies     Past Surgical History:  Procedure Laterality Date  . EYE SURGERY      Family History: No family history on file.  Social History:  reports that he has been smoking.  He has never used smokeless tobacco. He reports that he drinks about 3.0 oz of alcohol per week . He reports that he does not use drugs.  Additional Social History:  Alcohol / Drug Use Pain Medications: SEE MAR Prescriptions: SEE MAR Over the Counter: SEE MAR History of alcohol / drug use?: Yes Longest period of sobriety (when/how long): per pt. has not drank in 3 weeks, or since he was last seen for pneumonia Withdrawal  Symptoms: Other (Comment)  CIWA: CIWA-Ar BP: 122/71 Pulse Rate: 93 COWS:    PATIENT STRENGTHS: (choose at least two) Active sense of humor Average or above average intelligence Capable of independent living  Allergies: No Known Allergies  Home Medications:  (Not in a hospital admission)  OB/GYN Status:  No LMP for male patient.  General Assessment Data Location of Assessment: [facility] ED TTS Assessment: In system Is this a Tele or Face-to-Face Assessment?: Face-to-Face Is this an Initial Assessment or a Re-assessment for this encounter?: Initial Assessment Marital status: Single Maiden name: n/a Is patient pregnant?: No Pregnancy Status: No Living Arrangements: Other (Comment) (stays with roommate) Can pt return to current living arrangement?: Yes Admission Status: Involuntary Is patient capable of signing voluntary admission?: Yes Referral Source: Other Insurance type: [employer]     Crisis Care Plan Living Arrangements: Other (Comment) (stays with roommate) Name of Psychiatrist: none Name of Therapist: none  Education Status Is patient currently in school?: No Current Grade: n/a Highest grade of school patient has completed: unspecified Name of school: n/a Contact person: none given'  Risk to self with the past 6 months Suicidal Ideation: No Has patient been a risk to self within the past 6 months prior to admission? : No Suicidal Intent: No Has patient had any suicidal intent within the past 6 months prior to admission? : No Is patient at risk for suicide?: No Suicidal Plan?: No Has patient had any suicidal plan within the past 6 months prior to admission? : No Access to Means: No What has been your use of drugs/alcohol within the last 12 months?: alcohol Previous Attempts/Gestures: No How many times?: 0 Other Self Harm Risks: none noted Triggers for Past Attempts: None known Intentional Self Injurious Behavior: None Family Suicide History: No Recent  stressful life event(s): Other (Comment) Persecutory voices/beliefs?: No Depression: No Substance abuse history and/or treatment for substance abuse?: Yes Suicide prevention information given to non-admitted patients: Not applicable  Risk to Others within the past 6 months Homicidal Ideation: No Does patient have any lifetime risk of violence toward others beyond the six months prior to admission? : No Thoughts of Harm to Others: No Current Homicidal Intent: No Current Homicidal Plan: No Access to Homicidal Means: No Identified Victim: none History of harm to others?: No Assessment of Violence: None Noted Violent Behavior Description: none Does patient have access to weapons?: No Criminal Charges Pending?: No Does patient have a court date: No Is patient on probation?: No  Psychosis Hallucinations: Olfactory, Visual Delusions: None noted, Unspecified  Mental Status Report Appearance/Hygiene: In scrubs Eye Contact: Good Motor Activity: Freedom of movement Speech: Unremarkable Level of Consciousness: Alert Mood: Apprehensive Affect: Apprehensive Anxiety Level: Minimal Thought Processes: Coherent, Relevant Judgement: Partial Orientation: Person, Place, Time, Situation, Appropriate for developmental age Obsessive Compulsive Thoughts/Behaviors: None  Cognitive Functioning Concentration: Normal Memory: Recent Intact, Remote Intact IQ: Average Insight: Poor Impulse Control: Poor Appetite: Fair Weight Loss: 5 Weight Gain: 0 Sleep: No Change  Total Hours of Sleep: 6 Vegetative Symptoms: None  ADLScreening [facility] Assessment Services) Patient's cognitive ability adequate to safely complete daily activities?: Yes Patient able to express need for assistance with ADLs?: Yes Independently performs ADLs?: Yes (appropriate for developmental age)  Prior Inpatient Therapy Prior Inpatient Therapy: No Prior Therapy Dates: n/a Prior Therapy Facilty/Provider(s): n/a Reason for  Treatment: n/a  Prior Outpatient Therapy Prior Outpatient Therapy: No Prior Therapy Dates: n/a Prior Therapy Facilty/Provider(s): n/a Reason for Treatment: n/a Does patient have an ACCT team?: No Does patient have Intensive In-House Services?  : No Does patient have [facility] services? : No Does patient have P4CC services?: No  ADL Screening (condition at time of admission) Patient's cognitive ability adequate to safely complete daily activities?: Yes Is the patient deaf or have difficulty hearing?: No Does the patient have difficulty seeing, even when wearing glasses/contacts?: No Does the patient have difficulty concentrating, remembering, or making decisions?: No Patient able to express need for assistance with ADLs?: Yes Does the patient have difficulty dressing or bathing?: No Independently performs ADLs?: Yes (appropriate for developmental age) Does the patient have difficulty walking or climbing stairs?: No Weakness of Legs: None Weakness of Arms/Hands: None       Abuse/Neglect Assessment (Assessment to be complete while patient is alone) Physical Abuse: Denies Verbal Abuse: Denies Sexual Abuse: Denies Exploitation of patient/patient's resources: Denies Self-Neglect: Denies Values / Beliefs Cultural Requests During Hospitalization: None Spiritual Requests During Hospitalization: None   Advance Directives (For Healthcare) Does patient have an advance directive?: No Would patient like information on creating an advanced directive?: No - patient declined information    Additional Information 1:1 In Past 12 Months?: No CIRT Risk: No Elopement Risk: No Does patient have medical clearance?: No     Disposition:  Disposition Initial Assessment Completed for this Encounter: Yes Disposition of Patient: Other dispositions (TBD)  Gaylon Bentz k Taiden Raybourn 08/07/2016 5:09 PM

## 2016-08-07 NOTE — ED Provider Notes (Addendum)
[facility] Emergency Department Provider Note  ____________________________________________  Time seen: Approximately 3:26 PM  I have reviewed the triage vital signs and the nursing notes.   HISTORY  Chief Complaint Hallucinations   HPI Andre Rios is a 65 y.o. male with a history of COPD and daily alcohol use who presents to the emergency Department IVC by his wife for hallucinations. Patient was seen here yesterday for similar complaints. Patient has recently been started on prednisone 3 days ago for COPD exacerbation and since yesterday started having visual hallucinations. He is seeing people come in his house from the walls, he is seeing a singing choir in his house and also priests in his living room. Yesterday he was here and was told to stop prednisone. Patient is very confused and unable to provide history at this time so I am not sure if he stopped the prednisone. His wife is not here with him. Patient has no h/o hallucinations. Patient tells me that he is seeing things in his room now but unable to describe what they are, he says they look like lights. Also feels crawling sensation in his skin. He denies a headache or neck stiffness. Patient has not had a fever at home. Patient denies SI or HI.   _________________________ 4:01 PM on 08/07/2016 -----------------------------------------  Spoke with patient's wife on the phone and according to her patient has been having intermittent visual hallucinations over the course of the last 2 years. She reports that he is a heavy drinker and drinks about 10 beers a day. She is unclear if these hallucinations are brought on by being drunk up or with drawl. She does not remember him having any seizures when he stops drinking. She reports that the visual hallucinations at this time started happening before the patient started on the prednisone. She reports that he has never seen a psychiatrist for details. She does  endorse that for the last 2 years has been more forgetful, forgets to pay bills, more confused and has had memory issues. He does not carry a diagnosis of dementia as far she knows.  Past Medical History:  Diagnosis Date  . COPD (chronic obstructive pulmonary disease) ([place])   . Pneumonia   . Seasonal allergies     There are no active problems to display for this patient.   Past Surgical History:  Procedure Laterality Date  . EYE SURGERY      Prior to Admission medications   Medication Sig Start Date End Date Taking? Authorizing Provider  albuterol (PROVENTIL HFA;VENTOLIN HFA) 108 (90 Base) MCG/ACT inhaler Inhale 2 puffs into the lungs every 6 (six) hours as needed for wheezing or shortness of breath. 07/30/16   Schuyler Amor, MD  doxycycline (VIBRAMYCIN) 100 MG capsule Take 1 capsule (100 mg total) by mouth 2 (two) times daily. 07/30/16   Schuyler Amor, MD    Allergies Review of patient's allergies indicates no known allergies.  No family history on file.  Social History Social History  Substance Use Topics  . Smoking status: Current Every Day Smoker  . Smokeless tobacco: Never Used  . Alcohol use 3.0 oz/week    5 Cans of beer per week     Comment: several times a week    Review of Systems  Constitutional: Negative for fever. Eyes: Negative for visual changes. ENT: Negative for sore throat. Cardiovascular: Negative for chest pain. Respiratory: Negative for shortness of breath. Gastrointestinal: Negative for abdominal pain, vomiting or diarrhea. Genitourinary:  Negative for dysuria. Musculoskeletal: Negative for back pain. Skin: Negative for rash. Neurological: Negative for headaches, weakness or numbness. Psych: + visual hallucinations  ____________________________________________   PHYSICAL EXAM:  VITAL SIGNS: ED Triage Vitals  Enc Vitals Group     BP 08/07/16 1412 122/71     Pulse Rate 08/07/16 1412 93     Resp 08/07/16 1412 18     Temp 08/07/16 1412  98.4 F (36.9 C)     Temp Source 08/07/16 1412 Oral     SpO2 08/07/16 1412 98 %     Weight 08/07/16 1413 120 lb (54.4 kg)     Height 08/07/16 1413 5\' 6"  (1.676 m)     Head Circumference --      Peak Flow --      Pain Score --      Pain Loc --      Pain Edu? --      Excl. in [place]? --     Constitutional: Alert and oriented x2. Well appearing and in no apparent distress. HEENT:      Head: Normocephalic and atraumatic.         Eyes: Conjunctivae are normal. Sclera is non-icteric. EOMI. PERRL      Mouth/Throat: Mucous membranes are moist.       Neck: Supple with no signs of meningismus. Cardiovascular: Regular rate and rhythm. No murmurs, gallops, or rubs. 2+ symmetrical distal pulses are present in all extremities. No JVD. Respiratory: Normal respiratory effort. Lungs are clear to auscultation bilaterally. No wheezes, crackles, or rhonchi.  Gastrointestinal: Soft, non tender, and non distended with positive bowel sounds. No rebound or guarding. Musculoskeletal: Nontender with normal range of motion in all extremities. No edema, cyanosis, or erythema of extremities. Neurologic: Normal speech and language. Face is symmetric. Moving all extremities. No gross focal neurologic deficits are appreciated. Skin: Skin is warm, dry and intact. No rash noted. Psychiatric: Mood and affect are normal. Pacing in the room. Patient with current visual hallucinations. No Si or HI  ____________________________________________   LABS (all labs ordered are listed, but only abnormal results are displayed)  Labs Reviewed  COMPREHENSIVE METABOLIC PANEL - Abnormal; Notable for the following:       Result Value   Potassium 3.0 (*)    Glucose, Bld 151 (*)    BUN 23 (*)    AST 73 (*)    All other components within normal limits  CBC WITH DIFFERENTIAL/PLATELET - Abnormal; Notable for the following:    Platelets 137 (*)    All other components within normal limits  URINE DRUG SCREEN, QUALITATIVE ([facility] ONLY)  - Abnormal; Notable for the following:    Cannabinoid 50 Ng, Ur [place] POSITIVE (*)    All other components within normal limits  URINALYSIS COMPLETEWITH MICROSCOPIC ([facility] ONLY) - Abnormal; Notable for the following:    Color, Urine AMBER (*)    APPearance CLEAR (*)    Glucose, UA 50 (*)    Protein, ur 100 (*)    Squamous Epithelial / LPF 0-5 (*)    All other components within normal limits  POTASSIUM - Abnormal; Notable for the following:    Potassium 3.2 (*)    All other components within normal limits  ETHANOL   ____________________________________________  EKG  none ____________________________________________  RADIOLOGY  none  ____________________________________________   PROCEDURES  Procedure(s) performed: None Procedures Critical Care performed:  None ____________________________________________   INITIAL IMPRESSION / ASSESSMENT AND PLAN / ED COURSE  65 y.o. male with  a history of COPD who presents to the emergency Department IVC by his wife for visual hallucinations. Multiple possible etiologies including dementia with delirium, alcohol abuse or withdrawal, MJ induced hallucinations, prednisone side effect. Labs here showing hypokalemia with K 3.0 which I will supplement. Ethanol level pending however patient with normal VS which would argue against alcohol withdrawal. Tox screen positive for MJ which can also be contributing. Will consult psychiatry and keep IVC for now.  Clinical Course  Comment By Time  I spoke with patient's wife who came in to see him this evening. 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 He has had visual hallucinations intermittently for many years however over the course of the last few days been worse. Patient today was telling his wife that were purple ants coming from the  ceiling and a choir singing in the living room. Also asked his wife to grab his black suit and as he was getting ready to die tomorrow morning. The wife was scared and is also concerned the patient can no longer make financial decisions and therefore she decided to IVC him. According to the wife patient has not had alcohol in at least 2 weeks. He has never been evaluated by a psychiatrist. Rudene Re, MD 08/24 2053    Pertinent labs & imaging results that were available during my care of the patient were reviewed by me and considered in my medical decision making (see chart for details).    ____________________________________________   FINAL CLINICAL IMPRESSION(S) / ED DIAGNOSES  Final diagnoses:  Visual hallucinations  Hypokalemia      NEW MEDICATIONS STARTED DURING THIS VISIT:  New Prescriptions   No medications on file     Note:  This document was prepared using Dragon voice recognition software and may include unintentional dictation errors.    Rudene Re, MD 08/07/16 Keith, MD 08/07/16 [phone]

## 2016-08-07 NOTE — ED Notes (Signed)
Pt reports seeing roaches "all over bed", but no roaches noted when staff went into room.  Pt redirectable.

## 2016-08-07 NOTE — ED Notes (Signed)
Pt. Transferred to [facility] from ED to room 2. Report to include Situation, Background, Assessment and Recommendations from [facility]. Pt. Oriented to unit including Q15 minute rounds as well as the security cameras for their protection. Patient is alert and oriented, warm and dry in no acute distress. Patient denies SI, HI, and AVH. Pt. Encouraged to let me know if needs arise.

## 2016-08-08 DIAGNOSIS — F09 Unspecified mental disorder due to known physiological condition: Secondary | ICD-10-CM

## 2016-08-08 DIAGNOSIS — F1010 Alcohol abuse, uncomplicated: Secondary | ICD-10-CM

## 2016-08-08 DIAGNOSIS — Z046 Encounter for general psychiatric examination, requested by authority: Secondary | ICD-10-CM

## 2016-08-08 DIAGNOSIS — F1027 Alcohol dependence with alcohol-induced persisting dementia: Secondary | ICD-10-CM

## 2016-08-08 DIAGNOSIS — F99 Mental disorder, not otherwise specified: Secondary | ICD-10-CM

## 2016-08-08 NOTE — ED Notes (Signed)
Patient noted sleeping in room. No complaints, stable, in no acute distress. Q15 minute rounds and monitoring via [employer] to continue.  

## 2016-08-08 NOTE — ED Provider Notes (Signed)
-----------------------------------------   3:42 PM on 08/08/2016 -----------------------------------------   Blood pressure 124/82, pulse 75, temperature 98.2 F (36.8 C), temperature source Oral, resp. rate 16, height 5\' 6"  (1.676 m), weight 120 lb (54.4 kg), SpO2 98 %.  The patient had no acute events since last update.  Calm and cooperative at this time.  Patient clinically sober at this time. Seen by Dr. Weber Cooks who rescinded the patient's involuntary commitment and deems the patient appropriate for follow-up at [facility]. No longer having hallucinations. No suicidal or homicidal ideation at this time.    Orbie Pyo, MD 08/08/16 [phone]
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
## 2016-08-08 NOTE — ED Notes (Signed)
Pt states that for about the past 3 mos he has not been feeling well and had some life changes since he retired and his mother passed away. Says he lives alone and his x wife lives down the street .He did mention that after his meds which were potassium he felt better and no longer hears the voices. He is very pleasant and copperative

## 2016-08-08 NOTE — ED Provider Notes (Signed)
-----------------------------------------   7:37 AM on 08/08/2016 -----------------------------------------   Blood pressure 124/82, pulse 75, temperature 98.2 F (36.8 C), temperature source Oral, resp. rate 16, height 5\' 6"  (1.676 m), weight 120 lb (54.4 kg), SpO2 98 %.  The patient had no acute events since last update.  Calm and cooperative at this time.  Disposition is pending per Psychiatry/Behavioral Medicine team recommendations.     Paulette Blanch, MD 08/08/16 [phone]

## 2016-08-08 NOTE — ED Notes (Signed)
Pt went home with x wife

## 2016-08-08 NOTE — ED Notes (Signed)
Pt also states that he did stop drinking after his mother died

## 2016-08-08 NOTE — ED Notes (Signed)
Report given to oncoming shift.

## 2016-08-08 NOTE — Progress Notes (Signed)
Pt has requested that his wife not be informed that he is being released.  08/08/2016 Con Memos, MS, [place], [facility] Therapeutic Triage Specialist

## 2016-08-08 NOTE — Consult Note (Signed)
[facility] Consult   Reason for Consult:  Consult for 65 year old man with a history of alcohol abuse brought into the hospital because of new onset visual hallucinations Referring Physician:  [place] Patient Identification: Andre Rios MRN:  [id number] Principal Diagnosis: Organic psychosis Diagnosis:   Patient Active Problem List   Diagnosis Date Noted  . 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 Alcohol abuse [F10.10] 08/08/2016  . Involuntary commitment [Z04.6] 08/08/2016  . 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 male patient admitted with "it was that medicine I took for my pneumonia".  HPI:  Patient interviewed. Chart reviewed. Case reviewed with TTS and emergency room doctor. 65 year old man brought in because of concerns about visual hallucinations. Patient apparently started having visual hallucinations at home within the last day or so. Family reports that he was seeing people and acting more confused and acting strangely. Patient reports that he had just recently been seen in the hospital and been diagnosed with either pneumonia or a COPD flareup. He was given oral steroids. Soon after that he started to have visual hallucinations. Patient has insight now into the fact that there were not really things there and that these were hallucinations. He admits that he continues to drink heavily although he claims that it's only about 2 or 3 drinks a day which is different from what the family is claiming. Patient currently denies any mood symptoms. Denies depression. Denies suicidal or homicidal thoughts. He denies that he is currently having any hallucinations. He is alert and oriented and calm. Not displaying acutely disorganized or agitated behavior. He does have a mild tremor but appears to be ambulatory and safely able to take care of himself. He is not currently receiving any  psychiatric treatment.  Social history: Patient says he lives with a roommate. He is retired and separated from his wife. Talks a lot about how his wife just wants to inherit his money.  Medical history: Patient denies being aware of any regular medical problems. He is not taking any current prescription medicine.  Substance abuse history: Patient tends to minimize this. Says that his drinking is only occasionally a problem although he admits he is had DWIs in the past. He is not aware of ever having had seizures or delirium tremens from alcohol. Denies that he abuses any other drugs.  Past Psychiatric History: Patient denies any history of inpatient psychiatric treatment or treatment for anything except alcohol abuse. He says his only mental health contact was a DWI class required by the law. No history of suicide attempts or violence. Never been on any psychiatric medicine.  Risk to Self: Suicidal Ideation: No Suicidal Intent: No Is patient at risk for suicide?: No Suicidal Plan?: No Access to Means: No What has been your use of drugs/alcohol within the last 12 months?: alcohol How many times?: 0 Other Self Harm Risks: none noted Triggers for Past Attempts: None known Intentional Self Injurious Behavior: None Risk to Others: Homicidal Ideation: No Thoughts of Harm to Others: No Current Homicidal Intent: No Current Homicidal Plan: No Access to Homicidal Means: No Identified Victim: none History of harm to others?: No Assessment of Violence: None Noted Violent Behavior Description: none Does patient have access to weapons?: No Criminal Charges Pending?: No Does patient have a court date: No Prior Inpatient Therapy: Prior Inpatient Therapy: No Prior Therapy Dates: n/a Prior Therapy Facilty/Provider(s): n/a  Reason for Treatment: n/a Prior Outpatient Therapy: Prior Outpatient Therapy: No Prior Therapy Dates: n/a Prior Therapy Facilty/Provider(s): n/a Reason for Treatment:  n/a Does patient have an ACCT team?: No Does patient have Intensive In-House Services?  : No Does patient have [facility] services? : No Does patient have P4CC services?: No  Past Medical History:  Past Medical History:  Diagnosis Date  . COPD (chronic obstructive pulmonary disease) ([place])   . Pneumonia   . Seasonal allergies     Past Surgical History:  Procedure Laterality Date  . EYE SURGERY     Family History: No family history on file. Family Psychiatric  History: Family history positive for one nephew who has schizophrenia and several people with alcohol abuse Social History:  History  Alcohol Use  . 3.0 oz/week  . 5 Cans of beer per week    Comment: several times a week     History  Drug Use No    Social History   Social History  . Marital status: Married    Spouse name: N/A  . Number of children: N/A  . Years of education: N/A   Social History Main Topics  . Smoking status: Current Every Day Smoker  . Smokeless tobacco: Never Used  . Alcohol use 3.0 oz/week    5 Cans of beer per week     Comment: several times a week  . Drug use: No  . Sexual activity: Not Asked   Other Topics Concern  . None   Social History Narrative  . None   Additional Social History:    Allergies:  No Known Allergies  Labs:  Results for orders placed or performed during the hospital encounter of 08/07/16 (from the past 48 hour(s))  Comprehensive metabolic panel     Status: Abnormal   Collection Time: 08/07/16  2:21 PM  Result Value Ref Range   Sodium 140 135 - 145 mmol/L   Potassium 3.0 (L) 3.5 - 5.1 mmol/L   Chloride 103 101 - 111 mmol/L   CO2 26 22 - 32 mmol/L   Glucose, Bld 151 (H) 65 - 99 mg/dL   BUN 23 (H) 6 - 20 mg/dL   Creatinine, Ser 1.00 0.61 - 1.24 mg/dL   Calcium 10.0 8.9 - 10.3 mg/dL   Total Protein 7.7 6.5 - 8.1 g/dL   Albumin 4.2 3.5 - 5.0 g/dL   AST 73 (H) 15 - 41 U/L   ALT 55 17 - 63 U/L   Alkaline Phosphatase 101 38 - 126 U/L   Total Bilirubin 0.5 0.3  - 1.2 mg/dL   GFR calc non Af Amer >60 >60 mL/min   GFR calc Af Amer >60 >60 mL/min    Comment: (NOTE) The eGFR has been calculated using the CKD EPI equation. This calculation has not been validated in all clinical situations. eGFR's persistently <60 mL/min signify possible Chronic Kidney Disease.    Anion gap 11 5 - 15  Ethanol     Status: None   Collection Time: 08/07/16  2:21 PM  Result Value Ref Range   Alcohol, Ethyl (B) <5 <5 mg/dL    Comment:        LOWEST DETECTABLE LIMIT FOR SERUM ALCOHOL IS 5 mg/dL FOR MEDICAL PURPOSES ONLY   CBC with Diff     Status: Abnormal   Collection Time: 08/07/16  2:21 PM  Result Value Ref Range   WBC 6.4 3.8 - 10.6 K/uL   RBC 4.54 4.40 - 5.90 MIL/uL  Hemoglobin 14.2 13.0 - 18.0 g/dL   HCT 41.6 40.0 - 52.0 %   MCV 91.5 80.0 - 100.0 fL   MCH 31.2 26.0 - 34.0 pg   MCHC 34.1 32.0 - 36.0 g/dL   RDW 13.4 11.5 - 14.5 %   Platelets 137 (L) 150 - 440 K/uL   Neutrophils Relative % 69 %   Neutro Abs 4.4 1.4 - 6.5 K/uL   Lymphocytes Relative 17 %   Lymphs Abs 1.1 1.0 - 3.6 K/uL   Monocytes Relative 14 %   Monocytes Absolute 0.9 0.2 - 1.0 K/uL   Eosinophils Relative 0 %   Eosinophils Absolute 0.0 0 - 0.7 K/uL   Basophils Relative 0 %   Basophils Absolute 0.0 0 - 0.1 K/uL  Urine Drug Screen, Qualitative ([facility] only)     Status: Abnormal   Collection Time: 08/07/16  2:21 PM  Result Value Ref Range   Tricyclic, Ur Screen NONE DETECTED NONE DETECTED   Amphetamines, Ur Screen NONE DETECTED NONE DETECTED   MDMA (Ecstasy)Ur Screen NONE DETECTED NONE DETECTED   Cocaine Metabolite,Ur [place] NONE DETECTED NONE DETECTED   Opiate, Ur Screen NONE DETECTED NONE DETECTED   Phencyclidine (PCP) Ur S NONE DETECTED NONE DETECTED   Cannabinoid 50 Ng, Ur [place] POSITIVE (A) NONE DETECTED   Barbiturates, Ur Screen NONE DETECTED NONE DETECTED   Benzodiazepine, Ur Scrn NONE DETECTED NONE DETECTED   Methadone Scn, Ur NONE DETECTED NONE DETECTED    Comment: (NOTE) 202   Tricyclics, urine               Cutoff 1000 ng/mL 200  Amphetamines, urine             Cutoff 1000 ng/mL 300  MDMA (Ecstasy), urine           Cutoff 500 ng/mL 400  Cocaine Metabolite, urine       Cutoff 300 ng/mL 500  Opiate, urine                   Cutoff 300 ng/mL 600  Phencyclidine (PCP), urine      Cutoff 25 ng/mL 700  Cannabinoid, urine              Cutoff 50 ng/mL 800  Barbiturates, urine             Cutoff 200 ng/mL 900  Benzodiazepine, urine           Cutoff 200 ng/mL 1000 Methadone, urine                Cutoff 300 ng/mL 1100 1200 The urine drug screen provides only a preliminary, unconfirmed 1300 analytical test result and should not be used for non-medical 1400 purposes. Clinical consideration and professional judgment should 1500 be applied to any positive drug screen result due to possible 1600 interfering substances. A more specific alternate chemical method 1700 must be used in order to obtain a confirmed analytical result.  1800 Gas chromato graphy / mass spectrometry (GC/MS) is the preferred 1900 confirmatory method.   Urinalysis complete, with microscopic ([facility] only)     Status: Abnormal   Collection Time: 08/07/16  2:21 PM  Result Value Ref Range   Color, Urine AMBER (A) YELLOW   APPearance CLEAR (A) CLEAR   Glucose, UA 50 (A) NEGATIVE mg/dL   Bilirubin Urine NEGATIVE NEGATIVE   Ketones, ur NEGATIVE NEGATIVE mg/dL   Specific Gravity, Urine 1.021 1.005 - 1.030   Hgb urine dipstick NEGATIVE NEGATIVE  pH 5.0 5.0 - 8.0   Protein, ur 100 (A) NEGATIVE mg/dL   Nitrite NEGATIVE NEGATIVE   Leukocytes, UA NEGATIVE NEGATIVE   RBC / HPF 0-5 0 - 5 RBC/hpf   WBC, UA 0-5 0 - 5 WBC/hpf   Bacteria, UA NONE SEEN NONE SEEN   Squamous Epithelial / LPF 0-5 (A) NONE SEEN  Potassium     Status: Abnormal   Collection Time: 08/07/16  4:46 PM  Result Value Ref Range   Potassium 3.2 (L) 3.5 - 5.1 mmol/L    Current Facility-Administered Medications  Medication Dose Route  Frequency Provider Last Rate Last Dose  . potassium chloride SA (K-DUR,KLOR-CON) CR tablet 10 mEq  10 mEq Oral Daily Rudene Re, MD   10 mEq at 08/08/16 8182   Current Outpatient Prescriptions  Medication Sig Dispense Refill  . albuterol (PROVENTIL HFA;VENTOLIN HFA) 108 (90 Base) MCG/ACT inhaler Inhale 2 puffs into the lungs every 6 (six) hours as needed for wheezing or shortness of breath. 1 Inhaler 2  . doxycycline (VIBRAMYCIN) 100 MG capsule Take 1 capsule (100 mg total) by mouth 2 (two) times daily. 20 capsule 0    Musculoskeletal: Strength & Muscle Tone: within normal limits Gait & Station: normal Patient leans: N/A  Psychiatric Specialty Exam: Physical Exam  Nursing note and vitals reviewed. Constitutional: He appears well-developed.  HENT:  Head: Normocephalic and atraumatic.  Eyes: Conjunctivae are normal. Pupils are equal, round, and reactive to light.  Neck: Normal range of motion.  Cardiovascular: Regular rhythm and normal heart sounds.   Respiratory: Effort normal. No respiratory distress.  GI: Soft.  Musculoskeletal: Normal range of motion.  Neurological: He is alert. He displays tremor.  Skin: Skin is warm and dry.  Psychiatric: He has a normal mood and affect. His speech is normal and behavior is normal. Judgment normal. He expresses no suicidal ideation. He exhibits abnormal recent memory and abnormal remote memory.    Review of Systems  Constitutional: Negative.   HENT: Negative.   Eyes: Negative.   Respiratory: Negative.   Cardiovascular: Negative.   Gastrointestinal: Negative.   Musculoskeletal: Negative.   Skin: Negative.   Neurological: Negative.   Psychiatric/Behavioral: Positive for hallucinations, memory loss and substance abuse. Negative for depression and suicidal ideas. The patient has insomnia. The patient is not nervous/anxious.     Blood pressure 124/82, pulse 75, temperature 98.2 F (36.8 C), temperature source Oral, resp. rate 16,  height '5\' 6"'$  (1.676 m), weight 54.4 kg (120 lb), SpO2 98 %.Body mass index is 19.37 kg/m.  General Appearance: Fairly Groomed  Eye Contact:  Fair  Speech:  Slow  Volume:  Decreased  Mood:  Euthymic  Affect:  Appropriate  Thought Process:  Goal Directed  Orientation:  Full (Time, Place, and Person)  Thought Content:  Tangential  Suicidal Thoughts:  No  Homicidal Thoughts:  No  Memory:  Immediate;   Good Recent;   Poor Remote;   Fair  Judgement:  Fair  Insight:  Fair  Psychomotor Activity:  Tremor  Concentration:  Concentration: Fair  Recall:  [employer] of Knowledge:  Fair  Language:  Fair  Akathisia:  No  Handed:  Right  AIMS (if indicated):     Assets:  Agricultural consultant Housing Resilience  ADL's:  Intact  Cognition:  Impaired,  Mild  Sleep:        Treatment Plan Summary: Plan This is a 65 year old man who came in with confusion and hallucinations which have now  cleared up. On examination his short-term memory is poor. He has a little bit of insight into this. He has a few other abnormalities on his mental status exam that suggest probably a mild degree of dementia most likely related to alcohol abuse. He does not however appear to be acutely dangerous nor to meet commitment criteria. Patient was educated about the risks of continued drinking and the effect it is having on him. He will be given referral information to [facility]. Patient can be taken off commitment and discharged from the emergency room. Case reviewed with ER doctor.  Disposition: Patient does not meet criteria for psychiatric inpatient admission. Supportive therapy provided about ongoing stressors.  Alethia Berthold, MD 08/08/2016 2:45 PM

## 2016-08-08 NOTE — ED Notes (Signed)
Pt also states he has lost weight after his mother passed away

## 2016-09-26 ENCOUNTER — Encounter: Payer: Self-pay | Admitting: *Deleted

## 2016-09-26 ENCOUNTER — Emergency Department
Admission: EM | Admit: 2016-09-26 | Discharge: 2016-09-27 | Disposition: A | Discharge status: Home / Self Care | Payer: Self-pay | Attending: Emergency Medicine | Admitting: Emergency Medicine

## 2016-09-26 DIAGNOSIS — F1020 Alcohol dependence, uncomplicated: Secondary | ICD-10-CM

## 2016-09-26 DIAGNOSIS — F10120 Alcohol abuse with intoxication, uncomplicated: Secondary | ICD-10-CM | POA: Insufficient documentation

## 2016-09-26 DIAGNOSIS — F10920 Alcohol use, unspecified with intoxication, uncomplicated: Secondary | ICD-10-CM

## 2016-09-26 DIAGNOSIS — F17200 Nicotine dependence, unspecified, uncomplicated: Secondary | ICD-10-CM | POA: Insufficient documentation

## 2016-09-26 DIAGNOSIS — R443 Hallucinations, unspecified: Secondary | ICD-10-CM | POA: Insufficient documentation

## 2016-09-26 DIAGNOSIS — J449 Chronic obstructive pulmonary disease, unspecified: Secondary | ICD-10-CM | POA: Insufficient documentation

## 2016-09-26 DIAGNOSIS — Z5181 Encounter for therapeutic drug level monitoring: Secondary | ICD-10-CM | POA: Insufficient documentation

## 2016-09-26 LAB — COMPREHENSIVE METABOLIC PANEL
ALK PHOS: 83 U/L (ref 38–126)
ALT: 11 U/L — AB (ref 17–63)
AST: 24 U/L (ref 15–41)
Albumin: 3.9 g/dL (ref 3.5–5.0)
Anion gap: 8 (ref 5–15)
BUN: 11 mg/dL (ref 6–20)
CALCIUM: 9.3 mg/dL (ref 8.9–10.3)
CHLORIDE: 106 mmol/L (ref 101–111)
CO2: 23 mmol/L (ref 22–32)
CREATININE: 0.85 mg/dL (ref 0.61–1.24)
GFR calc non Af Amer: >60 mL/min (ref >60)
Glucose, Bld: 94 mg/dL (ref 65–99)
Potassium: 3.7 mmol/L (ref 3.5–5.1)
SODIUM: 137 mmol/L (ref 135–145)
Total Bilirubin: 0.6 mg/dL (ref 0.3–1.2)
Total Protein: 7 g/dL (ref 6.5–8.1)

## 2016-09-26 LAB — URINE DRUG SCREEN, QUALITATIVE (ARMC ONLY)
Amphetamines, Ur Screen: NOT DETECTED
BARBITURATES, UR SCREEN: NOT DETECTED
BENZODIAZEPINE, UR SCRN: NOT DETECTED
CANNABINOID 50 NG, UR ~~LOC~~: NOT DETECTED
Cocaine Metabolite,Ur ~~LOC~~: NOT DETECTED
MDMA (Ecstasy)Ur Screen: NOT DETECTED
Methadone Scn, Ur: NOT DETECTED
OPIATE, UR SCREEN: NOT DETECTED
PHENCYCLIDINE (PCP) UR S: NOT DETECTED
Tricyclic, Ur Screen: NOT DETECTED

## 2016-09-26 LAB — CBC
HEMATOCRIT: 42.9 % (ref 40.0–52.0)
Hemoglobin: 14.5 g/dL (ref 13.0–18.0)
MCH: 31.1 pg (ref 26.0–34.0)
MCHC: 33.8 g/dL (ref 32.0–36.0)
MCV: 92.2 fL (ref 80.0–100.0)
PLATELETS: 229 10*3/uL (ref 150–440)
RBC: 4.66 MIL/uL (ref 4.40–5.90)
RDW: 13.5 % (ref 11.5–14.5)
WBC: 6.6 10*3/uL (ref 3.8–10.6)

## 2016-09-26 LAB — ETHANOL: Alcohol, Ethyl (B): 160 mg/dL — ABNORMAL HIGH (ref <5)

## 2016-09-26 NOTE — ED Triage Notes (Signed)
Pt brought in by [facility] handcuffed.   Pt is IVC.  Pt admits to etoh.  Pt denies SI or HI.  No drug use.  Pt alert, calm and cooperative.

## 2016-09-27 ENCOUNTER — Emergency Department: Payer: Self-pay

## 2016-09-27 NOTE — ED Notes (Signed)
All belongings given to pt

## 2016-09-27 NOTE — ED Provider Notes (Signed)
[facility] Emergency Department Provider Note  ____________________________________________   First MD Initiated Contact with Patient 09/27/16 [phone]     (approximate)  I have reviewed the triage vital signs and the nursing notes.   HISTORY  Chief Complaint Behavior Problem    HPI Andre Rios is a 65 y.o. male presents in police custody under involuntary commitment.  He has been drinking alcohol and his wife called police because he was apparently aggressive, making threats to those around him, and having hallucinations.  He states that he has been having hallucinations for quite a while and that his current medications do not help.  He goes to a therapist who is prescribing his medicines and he states he has been taking them but he needs something different.  He denies any acute medical concerns as documented in the review of systems below.  Reportedly his symptoms are severe and somewhat gradual in onset although he has acute changes in his emotions which are apparently quite labile.   Past Medical History:  Diagnosis Date  . COPD (chronic obstructive pulmonary disease) ([place])   . Pneumonia   . Seasonal allergies     Patient Active Problem List   Diagnosis Date Noted  . 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 Alcohol abuse 08/08/2016  . Involuntary commitment 08/08/2016  . 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 EYE SURGERY      Prior to Admission medications   Medication Sig Start Date End Date Taking? Authorizing Provider  albuterol (PROVENTIL HFA;VENTOLIN HFA) 108 (90 Base) MCG/ACT inhaler Inhale 2 puffs into the lungs every 6 (six) hours as needed for wheezing or shortness of breath. 07/30/16  Yes Schuyler Amor, MD    Allergies Review of patient's allergies indicates no known allergies.  No family history on file.  Social History Social History  Substance Use Topics  . Smoking status:  Current Every Day Smoker  . Smokeless tobacco: Never Used  . Alcohol use 3.0 oz/week    5 Cans of beer per week     Comment: several times a week    Review of Systems Constitutional: No fever/chills Eyes: No visual changes. ENT: No sore throat. Cardiovascular: Denies chest pain. Respiratory: Denies shortness of breath. Gastrointestinal: No abdominal pain.  No nausea, no vomiting.  No diarrhea.  No constipation. Genitourinary: Negative for dysuria. Musculoskeletal: Negative for back pain. Skin: Negative for rash. Neurological: Negative for headaches, focal weakness or numbness. Psychiatric:Admits to visual and sensory hallucinations at time.  Denies SI/HI.  10-point ROS otherwise negative.  ____________________________________________   PHYSICAL EXAM:  VITAL SIGNS: ED Triage Vitals [09/26/16 2241]  Enc Vitals Group     BP 116/78     Pulse Rate 77     Resp 20     Temp 98.7 F (37.1 C)     Temp Source Oral     SpO2 99 %     Weight 122 lb (55.3 kg)     Height 5\' 6"  (1.676 m)     Head Circumference      Peak Flow      Pain Score      Pain Loc      Pain Edu?      Excl. in [place]?     Constitutional: Alert and oriented. Well appearing and in no acute distress. Eyes: Conjunctivae are normal. PERRL. EOMI. Head: Atraumatic. Nose: No congestion/rhinnorhea. Mouth/Throat: Mucous membranes are moist.  Oropharynx non-erythematous.  Neck: No stridor.  No meningeal signs.   Cardiovascular: Normal rate, regular rhythm. Good peripheral circulation. Grossly normal heart sounds. Respiratory: Normal respiratory effort.  No retractions. Lungs CTAB. Gastrointestinal: Soft and nontender. No distention.  Musculoskeletal: No lower extremity tenderness nor edema. No gross deformities of extremities. Neurologic:  Normal speech and language. No gross focal neurologic deficits are appreciated.  Ambulatory without difficulty.  Does not appear clinically intoxicated Skin:  Skin is warm, dry and  intact. No rash noted. Psychiatric: Mood and affect are normal. Speech and behavior are normal.  Denies SI/HI.  States his wife is tired of dealing with him, but also admits to having hallucinations  ____________________________________________   LABS (all labs ordered are listed, but only abnormal results are displayed)  Labs Reviewed  COMPREHENSIVE METABOLIC PANEL - Abnormal; Notable for the following:       Result Value   ALT 11 (*)    All other components within normal limits  ETHANOL - Abnormal; Notable for the following:    Alcohol, Ethyl (B) 160 (*)    All other components within normal limits  CBC  URINE DRUG SCREEN, QUALITATIVE ([facility] ONLY)   ____________________________________________  EKG  None - EKG not ordered by ED physician ____________________________________________  RADIOLOGY   No results found.  ____________________________________________   PROCEDURES  Procedure(s) performed:   Procedures   Critical Care performed: No ____________________________________________   INITIAL IMPRESSION / ASSESSMENT AND PLAN / ED COURSE  Pertinent labs & imaging results that were available during my care of the patient were reviewed by me and considered in my medical decision making (see chart for details).  The patient reportedly has a history of lung cancer according to his wife but he denies this and says that she made it up.  He is not having any chest pain or shortness of breath right now so I do not feel that additional workup in that regard as needed, but I will obtain a CT scan of his head because there is not one in the system and with his hallucinations and behavior changes it is worth looking for any evidence of metastases.  Otherwise there are no acute medical issues at this time.  I am upholding the involuntary commitment and we will obtain a psychiatric consult.   Clinical Course  Value Comment By Time  CT HEAD WO CONTRAST Nonacute head CT. Hinda Kehr, MD 10/14 [phone]   I reviewed the written report from the specialist on-call psychiatrist who believes that the history from the patient is accurate, he has good insight and judgment, and that he does not meet criteria for commitment.  She expresses some concern about the wife's motivations and sending him here.  She feels comfortable with the plan to discharge him for outpatient follow-up in addition to starting him on Seroquel for mood/anxiety.  I will proceed with this plan as I feel he does not represent an acute danger to himself or others. Hinda Kehr, MD 10/14 [phone]   IVC reversal has come through.  Will d/c per psych [facility] recommendations Hinda Kehr, MD 10/14 [phone]    ____________________________________________  FINAL CLINICAL IMPRESSION(S) / ED DIAGNOSES  Final diagnoses:  Moderate alcohol use disorder (HCC)  Alcoholic intoxication without complication ([place])     MEDICATIONS GIVEN DURING THIS VISIT:  Medications - No data to display   NEW OUTPATIENT MEDICATIONS STARTED DURING THIS VISIT:  New Prescriptions   No medications on file    Modified Medications   No medications  on file    Discontinued Medications   DOXYCYCLINE (VIBRAMYCIN) 100 MG CAPSULE    Take 1 capsule (100 mg total) by mouth 2 (two) times daily.     Note:  This document was prepared using Dragon voice recognition software and may include unintentional dictation errors.    Hinda Kehr, MD 09/28/16 [phone]

## 2016-09-27 NOTE — Discharge Instructions (Signed)

## 2016-09-27 NOTE — BH Assessment (Signed)
Assessment Note  Andre Rios is an 65 y.o. male presenting  to the ED under IVC, initiated by his wife.  According to the the IVC, patient is an alcoholic and was recently diagnosed with lung cancer and refusing treatment.  Patient is not taking his medications prescribed from his last hospital visit.  Patient is also hallucinating and believes that  People are coming into his room at night.  The IVC paperwork also states that patient is having full conversations with persons that are not present and that patient is starting arguments and threatening to fight others.  Patient has been seen in the ED previously.  Patient does admit to visual and auditory hallucinations.  He reports seeing people come in his house from the walls and seeing a singing choir in his house.  Patient reports  feeling crawling sensations in his skin.  al health related medication.   Patient denies current SI/HI or history of. Patient denies previous hx. Of psychotic symptoms. However, pt acknowledges current problems x 1-1.5 month with AVH, seeing objects, feeling skin crawl, and hearing voices, no command.  He does admit to alcohol use and states that he drinks about 8 beers a day.  BAC is 160.  Diagnosis: Alcohol Intoxication  Past Medical History:  Past Medical History:  Diagnosis Date  . COPD (chronic obstructive pulmonary disease) ([place])   . Pneumonia   . Seasonal allergies     Past Surgical History:  Procedure Laterality Date  . EYE SURGERY      Family History: No family history on file.  Social History:  reports that he has been smoking.  He has never used smokeless tobacco. He reports that he drinks about 3.0 oz of alcohol per week . He reports that he does not use drugs.  Additional Social History:  Alcohol / Drug Use History of alcohol / drug use?: Yes (Pt reports a history of drinking beer.)  CIWA: CIWA-Ar BP: 116/78 Pulse Rate: 77 COWS:    Allergies: No Known Allergies  Home Medications:   (Not in a hospital admission)  OB/GYN Status:  No LMP for male patient.  General Assessment Data Location of Assessment: [facility] ED TTS Assessment: In system Is this a Tele or Face-to-Face Assessment?: Face-to-Face Is this an Initial Assessment or a Re-assessment for this encounter?: Initial Assessment Marital status: Married [place] name: n/a Is patient pregnant?: No Pregnancy Status: No Living Arrangements: Spouse/significant other (stays with roommate) Can pt return to current living arrangement?: Yes Admission Status: Involuntary Is patient capable of signing voluntary admission?: Yes Referral Source: Self/Family/Friend Insurance type: [place] Screening Exam ([place]) Medical Exam completed: Yes  Crisis Care Plan Living Arrangements: Spouse/significant other (stays with roommate) Legal Guardian: Other: (self) Name of Psychiatrist: none Name of Therapist: none  Education Status Is patient currently in school?: No Current Grade: n/a Highest grade of school patient has completed: unspecified Name of school: n/a Contact person: none given'  Risk to self with the past 6 months Suicidal Ideation: No Has patient been a risk to self within the past 6 months prior to admission? : No Suicidal Intent: No Has patient had any suicidal intent within the past 6 months prior to admission? : No Is patient at risk for suicide?: No Suicidal Plan?: No Has patient had any suicidal plan within the past 6 months prior to admission? : No Access to Means: No What has been your use of drugs/alcohol within the last 12 months?: alcohol Previous Attempts/Gestures:  No How many times?: 0 Other Self Harm Risks: none noted Triggers for Past Attempts: None known Intentional Self Injurious Behavior: None Family Suicide History: No Recent stressful life event(s): Conflict (Comment) (Conflict with spouse) Persecutory voices/beliefs?: No Depression: No Substance abuse history and/or  treatment for substance abuse?: Yes Suicide prevention information given to non-admitted patients: Not applicable  Risk to Others within the past 6 months Homicidal Ideation: No Does patient have any lifetime risk of violence toward others beyond the six months prior to admission? : No Thoughts of Harm to Others: No Current Homicidal Intent: No Current Homicidal Plan: No Access to Homicidal Means: No Identified Victim: None identified History of harm to others?: No Assessment of Violence: None Noted Violent Behavior Description: None noted Does patient have access to weapons?: No Criminal Charges Pending?: No Does patient have a court date: No Is patient on probation?: No  Psychosis Hallucinations: Auditory Delusions: None noted  Mental Status Report Appearance/Hygiene: In scrubs Eye Contact: Good Motor Activity: Freedom of movement Speech: Unremarkable Level of Consciousness: Alert Mood: Anxious Affect: Apprehensive Anxiety Level: Minimal Thought Processes: Coherent, Relevant Judgement: Partial Orientation: Person, Place, Time, Situation, Appropriate for developmental age Obsessive Compulsive Thoughts/Behaviors: None  Cognitive Functioning Concentration: Normal Memory: Recent Intact, Remote Intact IQ: Average Insight: Fair Impulse Control: Fair Appetite: Fair Weight Loss: 5 Weight Gain: 0 Sleep: No Change Total Hours of Sleep: 6 Vegetative Symptoms: None  ADLScreening [facility] Assessment Services) Patient's cognitive ability adequate to safely complete daily activities?: Yes Patient able to express need for assistance with ADLs?: Yes Independently performs ADLs?: Yes (appropriate for developmental age)  Prior Inpatient Therapy Prior Inpatient Therapy: No Prior Therapy Dates: n/a Prior Therapy Facilty/Provider(s): n/a Reason for Treatment: n/a  Prior Outpatient Therapy Prior Outpatient Therapy: No Prior Therapy Dates: n/a Prior Therapy Facilty/Provider(s):  n/a Reason for Treatment: n/a Does patient have an ACCT team?: No Does patient have Intensive In-House Services?  : No Does patient have [facility] services? : No Does patient have P4CC services?: No  ADL Screening (condition at time of admission) Patient's cognitive ability adequate to safely complete daily activities?: Yes Patient able to express need for assistance with ADLs?: Yes Independently performs ADLs?: Yes (appropriate for developmental age)       Abuse/Neglect Assessment (Assessment to be complete while patient is alone) Physical Abuse: Denies Verbal Abuse: Denies Sexual Abuse: Denies Exploitation of patient/patient's resources: Denies Self-Neglect: Denies Values / Beliefs Cultural Requests During Hospitalization: None Spiritual Requests During Hospitalization: None Consults Spiritual Care Consult Needed: No Social Work Consult Needed: No Regulatory affairs officer (For Healthcare) Does patient have an advance directive?: No    Additional Information 1:1 In Past 12 Months?: No CIRT Risk: No Elopement Risk: No Does patient have medical clearance?: No     Disposition:  Disposition Initial Assessment Completed for this Encounter: Yes Disposition of Patient: Other dispositions (TBD) Other disposition(s): Other (Comment) (Pending Psych MD consult)  On Site Evaluation by:   Reviewed with Physician:    Yossi Hinchman C Dylen Mcelhannon 09/27/2016 1:25 AM

## 2016-10-13 ENCOUNTER — Emergency Department
Admission: EM | Admit: 2016-10-13 | Discharge: 2016-10-13 | Disposition: A | Discharge status: Home / Self Care | Payer: Self-pay | Attending: Emergency Medicine | Admitting: Emergency Medicine

## 2016-10-13 ENCOUNTER — Emergency Department: Payer: Self-pay

## 2016-10-13 DIAGNOSIS — F17200 Nicotine dependence, unspecified, uncomplicated: Secondary | ICD-10-CM | POA: Insufficient documentation

## 2016-10-13 DIAGNOSIS — J449 Chronic obstructive pulmonary disease, unspecified: Secondary | ICD-10-CM | POA: Insufficient documentation

## 2016-10-13 DIAGNOSIS — J069 Acute upper respiratory infection, unspecified: Secondary | ICD-10-CM | POA: Insufficient documentation

## 2016-10-13 HISTORY — DX: Alcohol abuse, uncomplicated: F10.10

## 2016-10-13 LAB — URINALYSIS COMPLETE WITH MICROSCOPIC (ARMC ONLY)
BACTERIA UA: NONE SEEN
BILIRUBIN URINE: NEGATIVE
GLUCOSE, UA: NEGATIVE mg/dL
Hgb urine dipstick: NEGATIVE
Ketones, ur: NEGATIVE mg/dL
Leukocytes, UA: NEGATIVE
NITRITE: NEGATIVE
Protein, ur: 100 mg/dL — AB
SPECIFIC GRAVITY, URINE: 1.018 (ref 1.005–1.030)
Squamous Epithelial / LPF: NONE SEEN
pH: 8 (ref 5.0–8.0)

## 2016-10-13 LAB — BASIC METABOLIC PANEL
Anion gap: 10 (ref 5–15)
BUN: 9 mg/dL (ref 6–20)
CALCIUM: 9.7 mg/dL (ref 8.9–10.3)
CO2: 29 mmol/L (ref 22–32)
CREATININE: 0.84 mg/dL (ref 0.61–1.24)
Chloride: 96 mmol/L — ABNORMAL LOW (ref 101–111)
GFR calc Af Amer: >60 mL/min (ref >60)
GLUCOSE: 108 mg/dL — AB (ref 65–99)
POTASSIUM: 4.1 mmol/L (ref 3.5–5.1)
SODIUM: 135 mmol/L (ref 135–145)

## 2016-10-13 LAB — CBC
HEMATOCRIT: 43 % (ref 40.0–52.0)
Hemoglobin: 14.7 g/dL (ref 13.0–18.0)
MCH: 30.6 pg (ref 26.0–34.0)
MCHC: 34.1 g/dL (ref 32.0–36.0)
MCV: 89.9 fL (ref 80.0–100.0)
PLATELETS: 145 10*3/uL — AB (ref 150–440)
RBC: 4.78 MIL/uL (ref 4.40–5.90)
RDW: 13.3 % (ref 11.5–14.5)
WBC: 6.5 10*3/uL (ref 3.8–10.6)

## 2016-10-13 LAB — TROPONIN I

## 2016-10-13 MED ORDER — BENZONATATE 100 MG PO CAPS: 100.0000 mg | ORAL_CAPSULE | Freq: Four times a day (QID) | ORAL | 0 refills | Status: DC | PRN

## 2016-10-13 NOTE — ED Triage Notes (Signed)
Pt c/o intermittent period of sweats with weakness and dizziness for the past week.Marland Kitchen

## 2016-10-13 NOTE — ED Notes (Signed)
This nurse spoke with [facility] in lab about add on troponin for this pt, states it is being tested.

## 2016-10-13 NOTE — ED Provider Notes (Signed)
[facility] Emergency Department Provider Note  ____________________________________________   I have reviewed the triage vital signs and the nursing notes.   HISTORY  Chief Complaint Excessive Sweating and Weakness    HPI Andre Rios is a 65 y.o. male who presents today complaining of runny nose and cough. He states he also sometimes got sweaty. He states he had a fever over the weekend but that's gone and during the time he had a fever he had an episode of sweatiness which has completely resolved.He continues to smoke. He does not have any fever or chills. He states his cough is nearly gone. He has "sinus" drainage. He denies any chest pain or shortness of breath he denies abdominal pain he denies any numbness and denies any focal weakness. At this time he feels "pretty good". He not want to be admitted to the hospital.       Past Medical History:  Diagnosis Date  . Alcohol abuse   . COPD (chronic obstructive pulmonary disease) ([place])   . Pneumonia   . Seasonal allergies     Patient Active Problem List   Diagnosis Date Noted  . 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 Alcohol abuse 08/08/2016  . Involuntary commitment 08/08/2016  . 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 EYE SURGERY      Prior to Admission medications   Medication Sig Start Date End Date Taking? Authorizing Provider  albuterol (PROVENTIL HFA;VENTOLIN HFA) 108 (90 Base) MCG/ACT inhaler Inhale 2 puffs into the lungs every 6 (six) hours as needed for wheezing or shortness of breath. 07/30/16   Schuyler Amor, MD    Allergies Review of patient's allergies indicates no known allergies.  No family history on file.  Social History Social History  Substance Use Topics  . Smoking status: Current Every Day Smoker  . Smokeless tobacco: Never Used  . Alcohol use 3.0 oz/week    5 Cans of beer per week     Comment: several times a  week    Review of Systems Constitutional: Had fever ending yesterday. Eyes: No visual changes. ENT: No sore throat. No stiff neck no neck pain Cardiovascular: Denies chest pain. Respiratory: Denies shortness of breath. Gastrointestinal:   no vomiting.  No diarrhea.  No constipation. Genitourinary: Negative for dysuria. Musculoskeletal: Negative lower extremity swelling Skin: Negative for rash. Neurological: Negative for severe headaches, focal weakness or numbness. 10-point ROS otherwise negative.  ____________________________________________   PHYSICAL EXAM:  VITAL SIGNS: ED Triage Vitals  Enc Vitals Group     BP 10/13/16 1422 136/83     Pulse Rate 10/13/16 1422 91     Resp 10/13/16 1422 17     Temp 10/13/16 1422 98.2 F (36.8 C)     Temp Source 10/13/16 1422 Oral     SpO2 10/13/16 1422 100 %     Weight 10/13/16 1421 122 lb (55.3 kg)     Height 10/13/16 1421 5\' 6"  (1.676 m)     Head Circumference --      Peak Flow --      Pain Score --      Pain Loc --      Pain Edu? --      Excl. in [place]? --     Constitutional: Alert and oriented. Well appearing and in no acute distress. Eyes: Conjunctivae are normal. PERRL. EOMI. Head: Atraumatic. Nose: No congestion/rhinnorhea. Mouth/Throat: Mucous membranes are moist.  Oropharynx non-erythematous. Neck:  No stridor.   Nontender with no meningismus Cardiovascular: Normal rate, regular rhythm. Grossly normal heart sounds.  Good peripheral circulation. Respiratory: Normal respiratory effort.  No retractions. Lungs CTAB. Abdominal: Soft and nontender. No distention. No guarding no rebound Back:  There is no focal tenderness or step off.  there is no midline tenderness there are no lesions noted. there is no CVA tenderness Musculoskeletal: No lower extremity tenderness, no upper extremity tenderness. No joint effusions, no DVT signs strong distal pulses no edema Neurologic:  Normal speech and language. No gross focal neurologic  deficits are appreciated.  Skin:  Skin is warm, dry and intact. No rash noted. Psychiatric: Mood and affect are normal. Speech and behavior are normal.  ____________________________________________   LABS (all labs ordered are listed, but only abnormal results are displayed)  Labs Reviewed  BASIC METABOLIC PANEL - Abnormal; Notable for the following:       Result Value   Chloride 96 (*)    Glucose, Bld 108 (*)    All other components within normal limits  CBC - Abnormal; Notable for the following:    Platelets 145 (*)    All other components within normal limits  URINALYSIS COMPLETEWITH MICROSCOPIC ([facility] ONLY) - Abnormal; Notable for the following:    Color, Urine YELLOW (*)    APPearance CLEAR (*)    Protein, ur 100 (*)    All other components within normal limits  TROPONIN I   ____________________________________________  EKG  I personally interpreted any EKGs ordered by me or triage Normal sinus rhythm at 94 bpm no acute ST elevation or depression normal axis unremarkable EKG ____________________________________________  RADIOLOGY  I reviewed any imaging ordered by me or triage that were performed during my shift and, if possible, patient and/or family made aware of any abnormal findings. ____________________________________________   PROCEDURES  Procedure(s) performed: None  Procedures  Critical Care performed: None  ____________________________________________   INITIAL IMPRESSION / ASSESSMENT AND PLAN / ED COURSE  Pertinent labs & imaging results that were available during my care of the patient were reviewed by me and considered in my medical decision making (see chart for details).  Patient is quite well-appearing, no evidence of ACS no chest pain or shortness of breath. Patient states he had a cough and a low-grade fever and he got sweaty with a fever over the weekend. He is not sweaty today. Nothing about this at this time seems to indicate a indolent  or occult infection, there is no evidence of pneumonia there is no evidence of dysuria or urinary tract infection or ACS PE or dissection. Patient is feeling 100% better. Vital signs are unremarkable. He likely something for this cold". I'll send him with cough medication although his lungs remain clear here. I do not pick antibiotics are warranted and do not think serial cardiac enzymes are warranted. Patient has a very unremarkable exam with a normal workup essentially. Return precautions and follow-up given and understood however.  Clinical Course   ____________________________________________   FINAL CLINICAL IMPRESSION(S) / ED DIAGNOSES  Final diagnoses:  None      This chart was dictated using voice recognition software.  Despite best efforts to proofread,  errors can occur which can change meaning.      Schuyler Amor, MD 10/13/16 [phone]

## 2016-12-11 ENCOUNTER — Emergency Department: Payer: BLUE CROSS/BLUE SHIELD

## 2016-12-11 ENCOUNTER — Encounter: Payer: Self-pay | Admitting: Emergency Medicine

## 2016-12-11 ENCOUNTER — Emergency Department
Admission: EM | Admit: 2016-12-11 | Discharge: 2016-12-11 | Disposition: A | Discharge status: Home / Self Care | Payer: BLUE CROSS/BLUE SHIELD | Attending: Emergency Medicine | Admitting: Emergency Medicine

## 2016-12-11 DIAGNOSIS — F17200 Nicotine dependence, unspecified, uncomplicated: Secondary | ICD-10-CM | POA: Insufficient documentation

## 2016-12-11 DIAGNOSIS — J449 Chronic obstructive pulmonary disease, unspecified: Secondary | ICD-10-CM | POA: Insufficient documentation

## 2016-12-11 DIAGNOSIS — J40 Bronchitis, not specified as acute or chronic: Secondary | ICD-10-CM | POA: Insufficient documentation

## 2016-12-11 MED ORDER — AZITHROMYCIN 250 MG PO TABS: ORAL_TABLET | ORAL | 0 refills | Status: DC

## 2016-12-11 MED ORDER — PREDNISONE 50 MG PO TABS: 50.0000 mg | ORAL_TABLET | Freq: Every day | ORAL | 0 refills | Status: DC

## 2016-12-11 MED ORDER — ALBUTEROL SULFATE HFA 108 (90 BASE) MCG/ACT IN AERS: 2.0000 | INHALATION_SPRAY | RESPIRATORY_TRACT | 0 refills | Status: DC | PRN

## 2016-12-11 NOTE — ED Provider Notes (Signed)
[facility] Emergency Department Provider Note  ____________________________________________  Time seen: Approximately 4:58 PM  I have reviewed the triage vital signs and the nursing notes.   HISTORY  Chief Complaint Cough; Nasal Congestion; and Fever    HPI Andre Rios is a 65 y.o. male who presents emergency department complaining of 3 week history of cough, nasal congestion, low-grade fevers. Patient states that symptoms are intermittent in nature. He denies any difficulty breathing. He denies any high fevers. He is not taking medications for this complaint prior to arrival. Patient does have a history of COPD. He denies taking daily medications for same. Patient is a current smoker and states that he knows he has to quit smoking but is in the process of establishing primary care to receive smoking cessation medications. No other complaints at this time.   Past Medical History:  Diagnosis Date  . Alcohol abuse   . COPD (chronic obstructive pulmonary disease) ([place])   . Pneumonia   . Seasonal allergies     Patient Active Problem List   Diagnosis Date Noted  . 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 Alcohol abuse 08/08/2016  . Involuntary commitment 08/08/2016  . 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 EYE SURGERY      Prior to Admission medications   Medication Sig Start Date End Date Taking? Authorizing Provider  albuterol (PROVENTIL HFA;VENTOLIN HFA) 108 (90 Base) MCG/ACT inhaler Inhale 2 puffs into the lungs every 4 (four) hours as needed for wheezing or shortness of breath. 12/11/16   Charline Bills Cuthriell, PA-C  azithromycin (ZITHROMAX Z-PAK) 250 MG tablet Take 2 tablets (500 mg) on  Day 1,  followed by 1 tablet (250 mg) once daily on Days 2 through 5. 12/11/16   Roderic Palau D Cuthriell, PA-C  predniSONE (DELTASONE) 50 MG tablet Take 1 tablet (50 mg total) by mouth daily with breakfast. 12/11/16    Charline Bills Cuthriell, PA-C    Allergies Patient has no known allergies.  History reviewed. No pertinent family history.  Social History Social History  Substance Use Topics  . Smoking status: Current Every Day Smoker  . Smokeless tobacco: Never Used  . Alcohol use 3.0 oz/week    5 Cans of beer per week     Comment: several times a week     Review of Systems  Constitutional: No fever/chills Eyes: No visual changes. No discharge ENT: Positive for nasal congestion. No sore throat or ear pain. Cardiovascular: no chest pain. Respiratory: Positive cough. No SOB. Gastrointestinal: No abdominal pain.  No nausea, no vomiting.   Musculoskeletal: Negative for musculoskeletal pain. Skin: Negative for rash, abrasions, lacerations, ecchymosis. Neurological: Negative for headaches, focal weakness or numbness. 10-point ROS otherwise negative.  ____________________________________________   PHYSICAL EXAM:  VITAL SIGNS: ED Triage Vitals [12/11/16 1628]  Enc Vitals Group     BP (!) 155/95     Pulse Rate 88     Resp 20     Temp 98.3 F (36.8 C)     Temp Source Oral     SpO2 95 %     Weight 122 lb (55.3 kg)     Height      Head Circumference      Peak Flow      Pain Score 0     Pain Loc      Pain Edu?      Excl. in [place]?      Constitutional: Alert and  oriented. Well appearing and in no acute distress. Eyes: Conjunctivae are normal. PERRL. EOMI. Head: Atraumatic. ENT:      Ears: EACs and TMs are unremarkable.      Nose: Mild clear congestion/rhinnorhea.      Mouth/Throat: Mucous membranes are moist.  Neck: No stridor.   Hematological/Lymphatic/Immunilogical: No cervical lymphadenopathy. Cardiovascular: Normal rate, regular rhythm. Normal S1 and S2.  Good peripheral circulation. Respiratory: Normal respiratory effort without tachypnea or retractions. Lungs with a few scattered respiratory wheezes. No rales or rhonchi.Kermit Balo air entry to the bases with no decreased or absent  breath sounds. Musculoskeletal: Full range of motion to all extremities. No gross deformities appreciated. Neurologic:  Normal speech and language. No gross focal neurologic deficits are appreciated.  Skin:  Skin is warm, dry and intact. No rash noted. Psychiatric: Mood and affect are normal. Speech and behavior are normal. Patient exhibits appropriate insight and judgement.   ____________________________________________   LABS (all labs ordered are listed, but only abnormal results are displayed)  Labs Reviewed - No data to display ____________________________________________  EKG   ____________________________________________  RADIOLOGY Diamantina Providence Cuthriell, personally viewed and evaluated these images (plain radiographs) as part of my medical decision making, as well as reviewing the written report by the radiologist.  Dg Chest 2 View  Result Date: 12/11/2016 CLINICAL DATA:  65 year old male with fever and productive cough for 1 month. COPD. Emphysema. Smoker. Initial encounter. EXAM: CHEST  2 VIEW COMPARISON:  10/13/2016 and earlier. FINDINGS: Chronic pulmonary hyperinflation with stable lung volumes. Emphysema with attenuation of bilateral broncho vascular markings and some apical bullous disease. No pneumothorax, pulmonary edema, pleural effusion or acute pulmonary opacity identified. Normal cardiac size and mediastinal contours. Visualized tracheal air column is within normal limits. No acute osseous abnormality identified. IMPRESSION: Chronic lung disease with hyperinflation and emphysema. No superimposed acute findings are identified. Electronically Signed   By: Genevie Ann M.D.   On: 12/11/2016 17:21    ____________________________________________    PROCEDURES  Procedure(s) performed:    Procedures    Medications - No data to display   ____________________________________________   INITIAL IMPRESSION / ASSESSMENT AND PLAN / ED COURSE  Pertinent labs &  imaging results that were available during my care of the patient were reviewed by me and considered in my medical decision making (see chart for details).  Review of the [place] CSRS was performed in accordance of the [place] prior to dispensing any controlled drugs.  Clinical Course     Patient's diagnosis is consistent with Bronchitis. Patient has had symptoms 3 weeks. He does have a history of COPD. As such, patient will be treated with a purulent inhaler, steroids, Zithromax. Patient will follow-up with primary care as needed. Patient is given ED precautions to return to the ED for any worsening or new symptoms.     ____________________________________________  FINAL CLINICAL IMPRESSION(S) / ED DIAGNOSES  Final diagnoses:  Bronchitis  Chronic obstructive pulmonary disease, unspecified COPD type ([place])      NEW MEDICATIONS STARTED DURING THIS VISIT:  New Prescriptions   ALBUTEROL (PROVENTIL HFA;VENTOLIN HFA) 108 (90 BASE) MCG/ACT INHALER    Inhale 2 puffs into the lungs every 4 (four) hours as needed for wheezing or shortness of breath.   AZITHROMYCIN (ZITHROMAX Z-PAK) 250 MG TABLET    Take 2 tablets (500 mg) on  Day 1,  followed by 1 tablet (250 mg) once daily on Days 2 through 5.   PREDNISONE (DELTASONE) 50 MG TABLET  Take 1 tablet (50 mg total) by mouth daily with breakfast.        This chart was dictated using voice recognition software/Dragon. Despite best efforts to proofread, errors can occur which can change the meaning. Any change was purely unintentional.    Darletta Moll, PA-C 12/11/16 1825    Drenda Freeze, MD 12/12/16 1755

## 2016-12-11 NOTE — ED Triage Notes (Addendum)
Pt to ed with c/o fever, cough, congestion x several days. Productive cough, reports white, thick sputum.  Pt in no acute resp distress, skin warm and dry.  Pt states took [employer] at 1230 today.

## 2016-12-11 NOTE — ED Notes (Signed)
See triage note  Cough and congestion for about 1 week afebrile on arrival  resp even and non labored

## 2017-01-31 ENCOUNTER — Emergency Department: Payer: Self-pay

## 2017-01-31 ENCOUNTER — Emergency Department
Admission: EM | Admit: 2017-01-31 | Discharge: 2017-01-31 | Disposition: A | Discharge status: Home / Self Care | Payer: Self-pay | Attending: Student in an Organized Health Care Education/Training Program | Admitting: Student in an Organized Health Care Education/Training Program

## 2017-01-31 DIAGNOSIS — Z79899 Other long term (current) drug therapy: Secondary | ICD-10-CM | POA: Insufficient documentation

## 2017-01-31 DIAGNOSIS — J449 Chronic obstructive pulmonary disease, unspecified: Secondary | ICD-10-CM | POA: Insufficient documentation

## 2017-01-31 DIAGNOSIS — F17200 Nicotine dependence, unspecified, uncomplicated: Secondary | ICD-10-CM | POA: Insufficient documentation

## 2017-01-31 DIAGNOSIS — F1010 Alcohol abuse, uncomplicated: Secondary | ICD-10-CM

## 2017-01-31 DIAGNOSIS — F10121 Alcohol abuse with intoxication delirium: Secondary | ICD-10-CM | POA: Insufficient documentation

## 2017-01-31 DIAGNOSIS — F10921 Alcohol use, unspecified with intoxication delirium: Secondary | ICD-10-CM

## 2017-01-31 LAB — BASIC METABOLIC PANEL
ANION GAP: 10 (ref 5–15)
BUN: <5 mg/dL — ABNORMAL LOW (ref 6–20)
CO2: 24 mmol/L (ref 22–32)
Calcium: 9 mg/dL (ref 8.9–10.3)
Chloride: 108 mmol/L (ref 101–111)
Creatinine, Ser: 0.95 mg/dL (ref 0.61–1.24)
Glucose, Bld: 92 mg/dL (ref 65–99)
POTASSIUM: 4 mmol/L (ref 3.5–5.1)
Sodium: 142 mmol/L (ref 135–145)

## 2017-01-31 LAB — CBC
HCT: 50.4 % (ref 40.0–52.0)
Hemoglobin: 17.2 g/dL (ref 13.0–18.0)
MCH: 30.8 pg (ref 26.0–34.0)
MCHC: 34.2 g/dL (ref 32.0–36.0)
MCV: 89.9 fL (ref 80.0–100.0)
PLATELETS: 219 10*3/uL (ref 150–440)
RBC: 5.6 MIL/uL (ref 4.40–5.90)
RDW: 14.1 % (ref 11.5–14.5)
WBC: 3.6 10*3/uL — AB (ref 3.8–10.6)

## 2017-01-31 LAB — ETHANOL: Alcohol, Ethyl (B): 364 mg/dL (ref <5)

## 2017-01-31 MED ORDER — HALOPERIDOL LACTATE 5 MG/ML IJ SOLN
2.0000 mg | Freq: Once | INTRAMUSCULAR | Status: AC
  Administered 2017-01-31: 2 mg via INTRAVENOUS
  Filled 2017-01-31: qty 1

## 2017-01-31 MED ORDER — VITAMIN B-1 100 MG PO TABS
500.0000 mg | ORAL_TABLET | Freq: Once | ORAL | Status: AC
  Administered 2017-01-31: 500 mg via ORAL
  Filled 2017-01-31: qty 5

## 2017-01-31 MED ORDER — HALOPERIDOL LACTATE 5 MG/ML IJ SOLN: 2.0000 mg | Freq: Once | INTRAMUSCULAR | Status: DC

## 2017-01-31 MED ORDER — HALOPERIDOL LACTATE 5 MG/ML IJ SOLN: 5.0000 mg | Freq: Once | INTRAMUSCULAR | Status: DC

## 2017-01-31 MED ORDER — FOLIC ACID 1 MG PO TABS
1.0000 mg | ORAL_TABLET | Freq: Once | ORAL | Status: AC
Start: 2017-01-31 — End: 2017-01-31
  Administered 2017-01-31: 1 mg via ORAL
  Filled 2017-01-31: qty 1

## 2017-01-31 NOTE — ED Notes (Signed)
Pt alert to self, place, situation, unsure of date. Pt sipping on po fluids. Pt moving all extremities without difficulty, perrl 28mm and sluggish. Speech clear, resps unlabored.

## 2017-01-31 NOTE — ED Notes (Signed)
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 Pt alert, appears intoxicated, denies pain. Vss.

## 2017-01-31 NOTE — ED Notes (Signed)
md back in room to reassess. md orders discharge after reassessment. Dr. Quentin Cornwall requesting police or cab to take pt home. Attempted to contact pt's friends and spouse for a sober ride home without success. Charge rn lea notified regarding pt's order for discharge per dr. Quentin Cornwall. Charge rn in contact with bpd for a ride home.

## 2017-01-31 NOTE — ED Triage Notes (Addendum)
Pt came to ED via EMS. Wife called EMS, found pt passed out on driveway. History of seizures. When EMS arrived, pt smelled of alcohol. Pt brought in by EMS w/assistance of [place] [employer] for safety, as patient is intoxicated, confused why he is being brought to hospital. Vs stable. Alert, confused, but can follow some commands.

## 2017-01-31 NOTE — ED Notes (Signed)
Pt discharged with bpd officer mclaughlin at this time.

## 2017-01-31 NOTE — ED Notes (Signed)
Pt up to toilet and ambulatory with steady gait.

## 2017-01-31 NOTE — ED Provider Notes (Signed)
[facility] Emergency Department Provider Note    First MD Initiated Contact with Patient 01/31/17 1540     (approximate)  I have reviewed the triage vital signs and the nursing notes.   HISTORY  Chief Complaint Alcohol Intoxication    HPI Andre Rios is a 66 y.o. male with a history of significant alcohol abuse as well as seizures secondary to withdrawal presents after being found passed out on his driveway. Wife called EMS due to concern for seizure. Patient smelled of alcohol. Clearly intoxicated and admits to drinking 440 ounce beers this morning. There is no witnessed seizure activity. In route with EMS the patient was uncooperative requiring police to escort EMS to the hospital. Patient frequent standing up in the ambulance at [place] stating he wanted to leave. Eyes any other symptoms. No nausea vomiting or pain.   Past Medical History:  Diagnosis Date  . Alcohol abuse   . COPD (chronic obstructive pulmonary disease) ([place])   . Pneumonia   . Seasonal allergies    No family history on file. Past Surgical History:  Procedure Laterality Date  . EYE SURGERY     Patient Active Problem List   Diagnosis Date Noted  . 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 Alcohol abuse 08/08/2016  . Involuntary commitment 08/08/2016  . 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 Authorizing Provider  albuterol (PROVENTIL HFA;VENTOLIN HFA) 108 (90 Base) MCG/ACT inhaler Inhale 2 puffs into the lungs every 4 (four) hours as needed for wheezing or shortness of breath. 12/11/16   Charline Bills Cuthriell, PA-C  azithromycin (ZITHROMAX Z-PAK) 250 MG tablet Take 2 tablets (500 mg) on  Day 1,  followed by 1 tablet (250 mg) once daily on Days 2 through 5. 12/11/16   Roderic Palau D Cuthriell, PA-C  predniSONE (DELTASONE) 50 MG tablet Take 1 tablet (50 mg total) by mouth daily with breakfast. 12/11/16    Charline Bills Cuthriell, PA-C    Allergies Patient has no known allergies.    Social History Social History  Substance Use Topics  . Smoking status: Current Every Day Smoker  . Smokeless tobacco: Never Used  . Alcohol use 3.0 oz/week    5 Cans of beer per week     Comment: several times a week    Review of Systems Patient denies headaches, rhinorrhea, blurry vision, numbness, shortness of breath, chest pain, edema, cough, abdominal pain, nausea, vomiting, diarrhea, dysuria, fevers, rashes or hallucinations unless otherwise stated above in HPI. ____________________________________________   PHYSICAL EXAM:  VITAL SIGNS: Vitals:   01/31/17 1931 01/31/17 2037  BP: 111/74 116/76  Pulse:  76  Resp:  20  Temp:      Constitutional: Alert but heavily intoxicated, pleasant but requiring redirection to stay seated safely in bed Eyes: Conjunctivae are normal. PERRL. EOMI. Head: Atraumatic. Nose: No congestion/rhinnorhea. Mouth/Throat: Mucous membranes are moist.  Oropharynx non-erythematous. Neck: No stridor. Painless ROM. No cervical spine tenderness to palpation Hematological/Lymphatic/Immunilogical: No cervical lymphadenopathy. Cardiovascular: Normal rate, regular rhythm. Grossly normal heart sounds.  Good peripheral circulation. Respiratory: Normal respiratory effort.  No retractions. Lungs CTAB. Gastrointestinal: Soft and nontender. No distention. No abdominal bruits. No CVA tenderness. Musculoskeletal: No lower extremity tenderness nor edema.  No joint effusions. Neurologic:  slurred speech and dysarthric language. No gross focal neurologic deficits are appreciated unsteady gait Skin:  Skin is warm, dry and intact. No rash noted. Psychiatric: Mood and  affect are normal. Speech and behavior are normal.  ____________________________________________   LABS (all labs ordered are listed, but only abnormal results are displayed)  Results for orders placed or performed during  the hospital encounter of 01/31/17 (from the past 24 hour(s))  CBC     Status: Abnormal   Collection Time: 01/31/17  3:38 PM  Result Value Ref Range   WBC 3.6 (L) 3.8 - 10.6 K/uL   RBC 5.60 4.40 - 5.90 MIL/uL   Hemoglobin 17.2 13.0 - 18.0 g/dL   HCT 50.4 40.0 - 52.0 %   MCV 89.9 80.0 - 100.0 fL   MCH 30.8 26.0 - 34.0 pg   MCHC 34.2 32.0 - 36.0 g/dL   RDW 14.1 11.5 - 14.5 %   Platelets 219 150 - 440 K/uL  Basic metabolic panel     Status: Abnormal   Collection Time: 01/31/17  3:38 PM  Result Value Ref Range   Sodium 142 135 - 145 mmol/L   Potassium 4.0 3.5 - 5.1 mmol/L   Chloride 108 101 - 111 mmol/L   CO2 24 22 - 32 mmol/L   Glucose, Bld 92 65 - 99 mg/dL   BUN <5 (L) 6 - 20 mg/dL   Creatinine, Ser 0.95 0.61 - 1.24 mg/dL   Calcium 9.0 8.9 - 10.3 mg/dL   GFR calc non Af Amer >60 >60 mL/min   GFR calc Af Amer >60 >60 mL/min   Anion gap 10 5 - 15   ____________________________________________ ____ ED ECG REPORT I, Merlyn Lot, the attending physician, personally viewed and interpreted this ECG.   Date: 01/31/2017  EKG Time: 16:51  Rate: 80  Rhythm: sinus  Axis: normal  Intervals: normal intervals  ST&T Change: non specific st changes, poor R wave progression, no SMEMI  ________________________________________  RADIOLOGY  I personally reviewed all radiographic images ordered to evaluate for the above acute complaints and reviewed radiology reports and findings.  These findings were personally discussed with the patient.  Please see medical record for radiology report.  ____________________________________________   PROCEDURES  Procedure(s) performed:  Procedures    Critical Care performed: no ____________________________________________   INITIAL IMPRESSION / ASSESSMENT AND PLAN / ED COURSE  Pertinent labs & imaging results that were available during my care of the patient were reviewed by me and considered in my medical decision making (see chart for  details).  DDX: Dehydration, sepsis, pna, uti, hypoglycemia, cva, drug effect, withdrawal, encephalitis   Andre Rios is a 66 y.o. who presents to the ED with alcohol intoxication presents with unwitnessed fall. Based on his acute encephalopathy most likely secondary to alcohol however with history of reported seizures and possible head injury will order CT imaging to evaluate for acute intracranial abnormality. We'll obtain blood work to evaluate for any significant hyponatremia or electrolyte imbalance. We'll provide hemodynamic monitoring and symptomatic management.  The patient will be placed on continuous pulse oximetry and telemetry for monitoring.  Laboratory evaluation will be sent to evaluate for the above complaints.     Clinical Course as of Feb 01 2312  Sat Jan 31, 2017  1704 Na normal.  CBC reassuring.  [PR]  1840 CT head and neck with NAICA.  CXR withgout evidence of effusion, consolidation or ptx.  [PR]  2027 Patient reassessed now ambulating with a steady gait. He's tolerated oral hydration. Denies any SI or HI. He is clinically sober and stable for discharge home.  [PR]    Clinical Course User Index [PR]  Merlyn Lot, MD     ____________________________________________   FINAL CLINICAL IMPRESSION(S) / ED DIAGNOSES  Final diagnoses:  Acute alcoholic intoxication with delirium ([place])  ETOH abuse      NEW MEDICATIONS STARTED DURING THIS VISIT:  New Prescriptions   No medications on file     Note:  This document was prepared using Dragon voice recognition software and may include unintentional dictation errors.    Merlyn Lot, MD 01/31/17 [phone]

## 2017-01-31 NOTE — ED Notes (Signed)
Note: departure condition and discharge instructions reviewed at 2037. md orders discharge and dr. Quentin Cornwall states "he is clinically sober, he can go." awaiting police for ride home at this time. Pt remains in treatment room awaiting ride.

## 2017-01-31 NOTE — ED Notes (Signed)
Report from felicia, rn.  

## 2017-02-05 ENCOUNTER — Other Ambulatory Visit: Payer: Self-pay | Admitting: Family Medicine

## 2017-02-05 DIAGNOSIS — N63 Unspecified lump in unspecified breast: Secondary | ICD-10-CM

## 2017-02-17 ENCOUNTER — Ambulatory Visit
Admission: RE | Admit: 2017-02-17 | Discharge: 2017-02-17 | Disposition: A | Discharge status: Home / Self Care | Payer: BLUE CROSS/BLUE SHIELD | Source: Ambulatory Visit | Attending: Family Medicine | Admitting: Family Medicine

## 2017-02-17 DIAGNOSIS — N6310 Unspecified lump in the right breast, unspecified quadrant: Secondary | ICD-10-CM | POA: Insufficient documentation

## 2017-02-17 DIAGNOSIS — N63 Unspecified lump in unspecified breast: Secondary | ICD-10-CM

## 2017-04-17 IMAGING — US US BREAST*R* LIMITED INC AXILLA
1 series · 5 of 5 positions shown · non-contrast
Comparison: Previous exam(s).

CLINICAL DATA: Palpable lump right breast

EXAM:
2D DIGITAL DIAGNOSTIC BILATERAL MAMMOGRAM WITH CAD AND ADJUNCT TOMO
ULTRASOUND BILATERAL BREAST

[Series 1: us breast*right* limited inc axilla · 0.06mm/px · 5 of 5 slices shown]
[im 1/5]
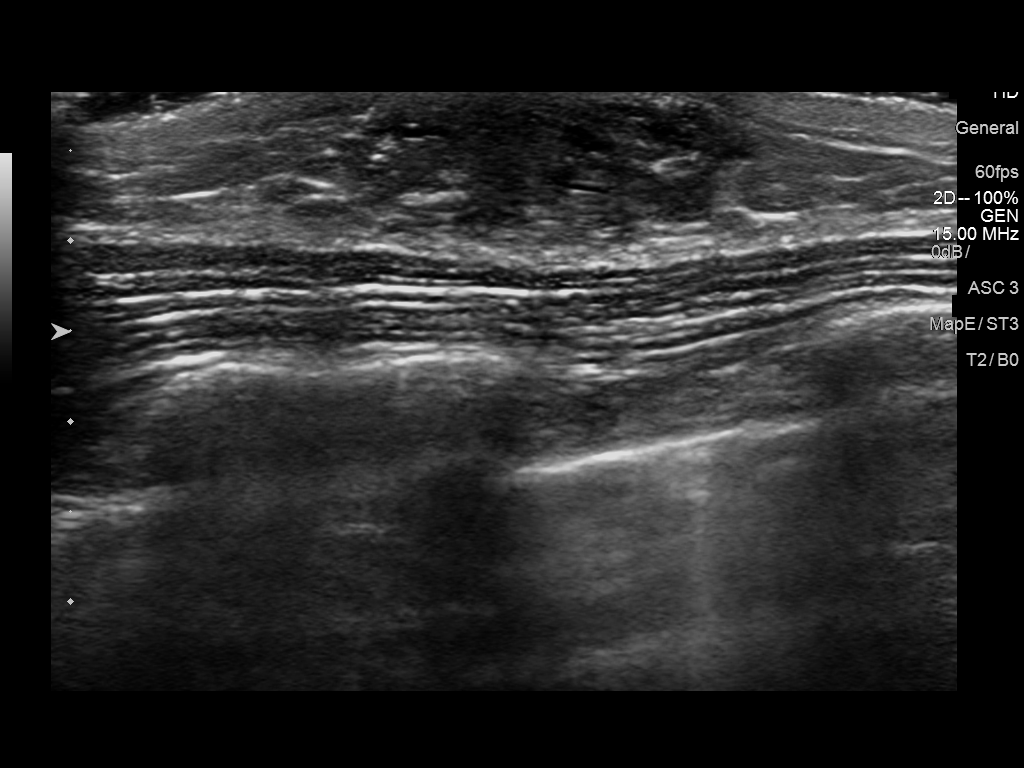
[im 2/5]
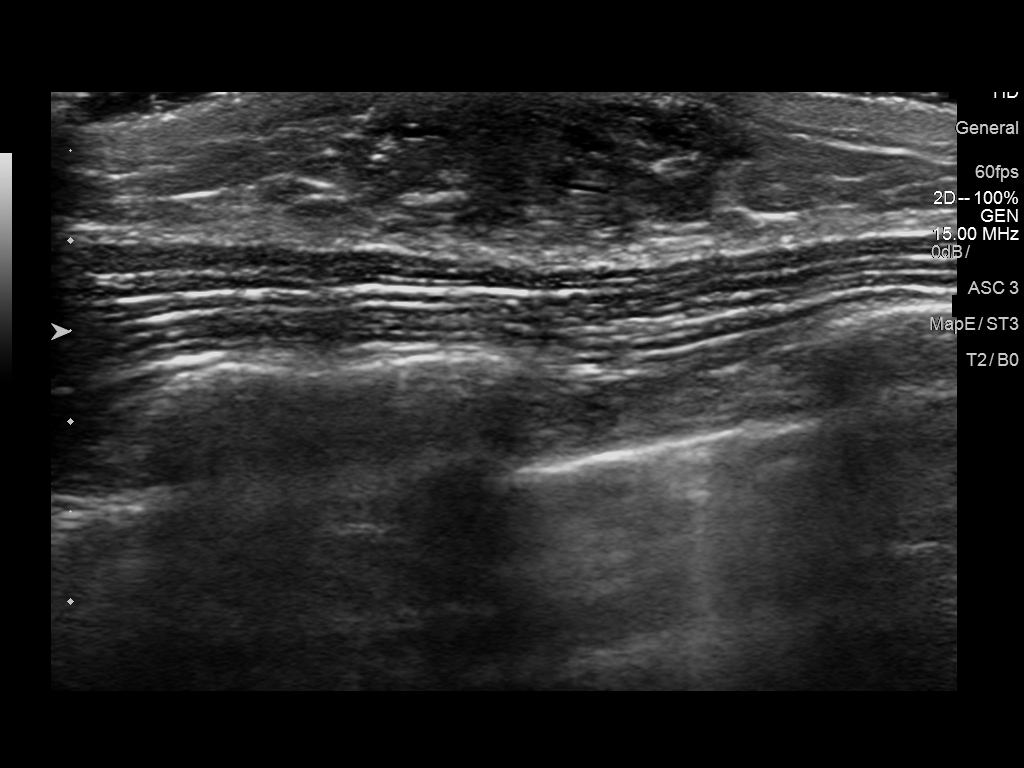
[im 3/5]
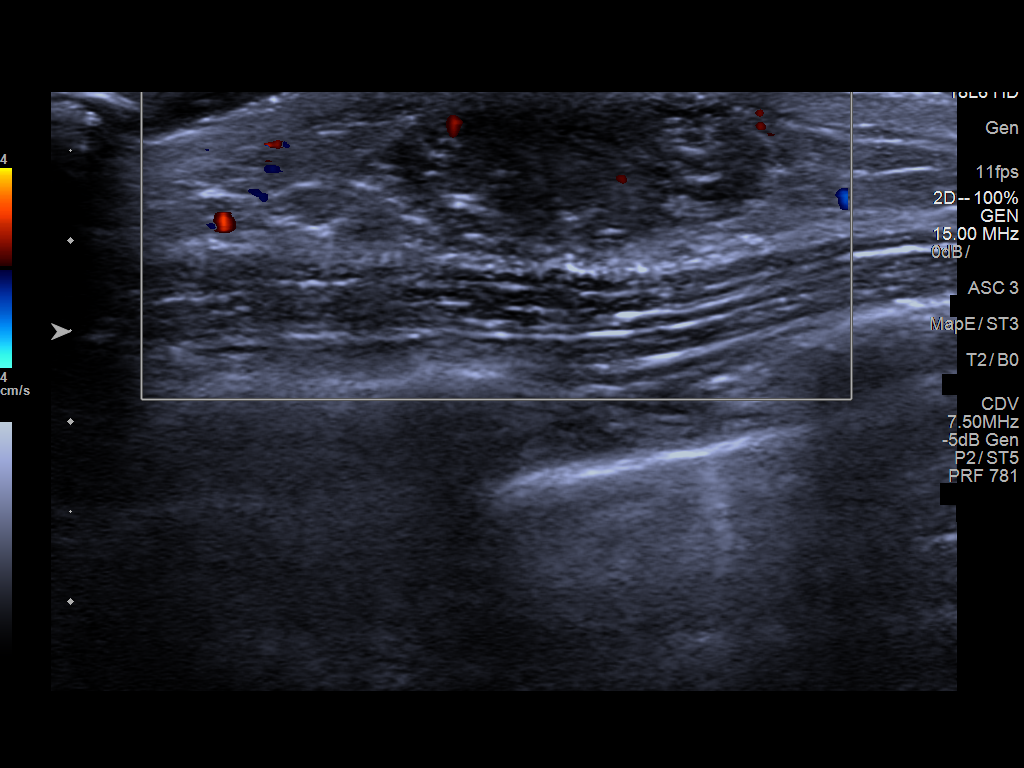
[im 4/5]
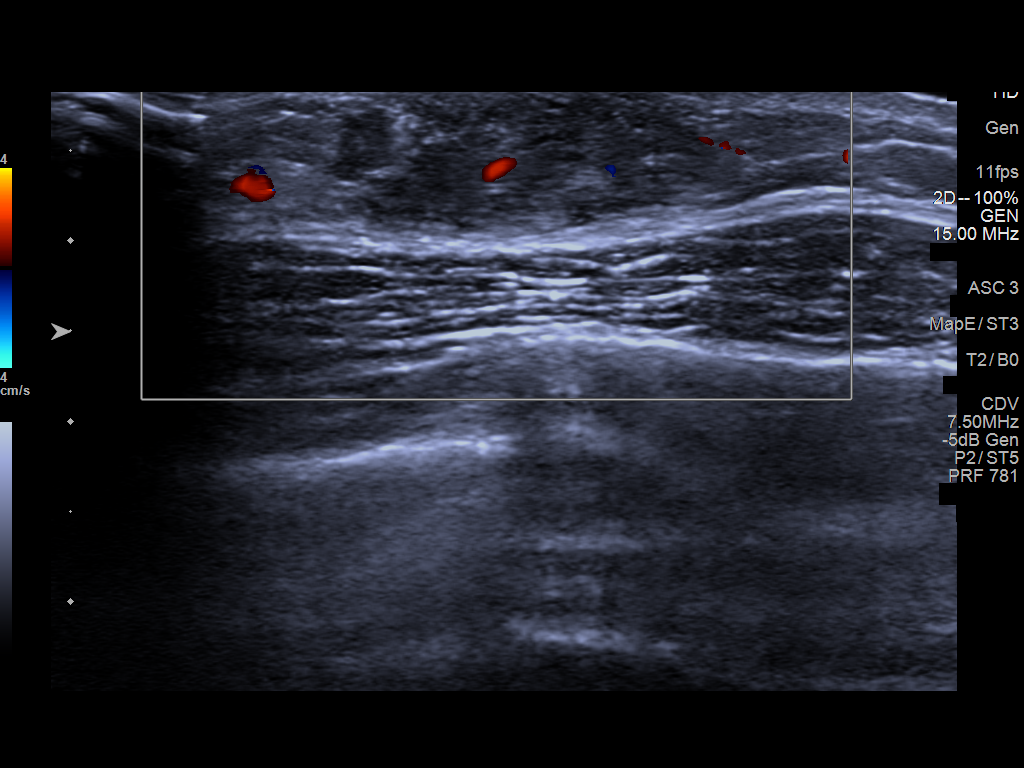
[im 5/5]
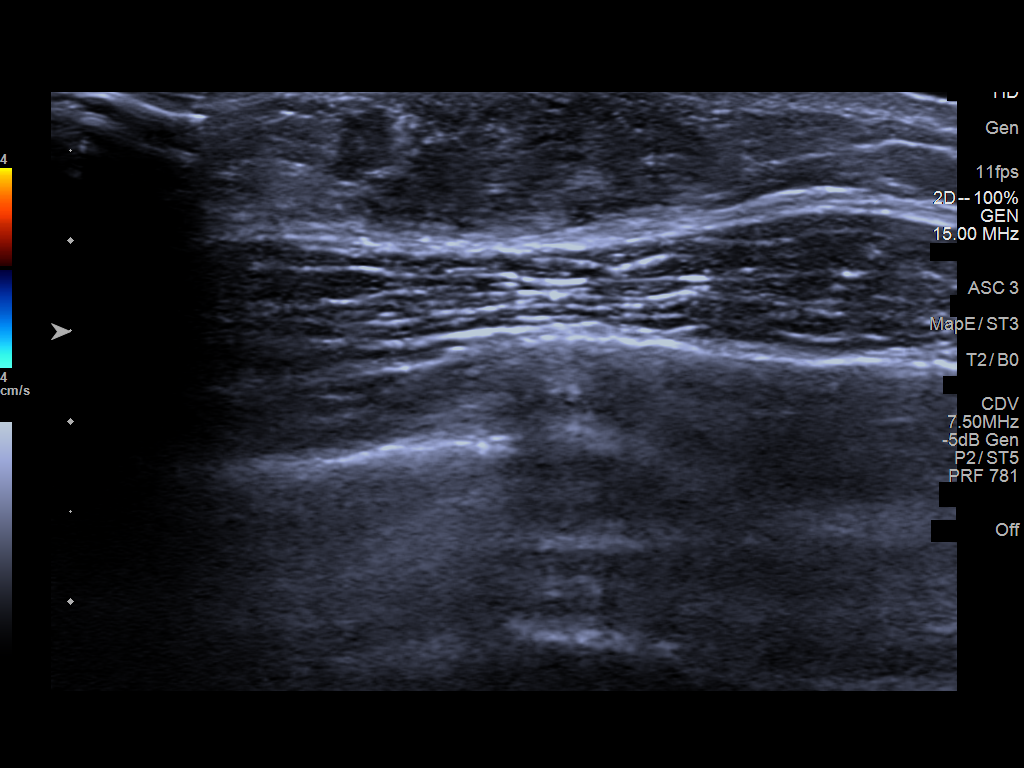

[5 of 5 positions shown; findings below may reference images not displayed]

ACR Breast Density Category b: There are scattered areas of
fibroglandular density.
FINDINGS: Cc and MLO views of bilateral breasts, spot tangential view of right
breast are submitted. There are bilateral symmetrical densities in
the retroareolar breast. No suspicious microcalcifications are
identified.

Mammographic images were processed with CAD.

Targeted ultrasound is performed, showing probable fibroglandular
tissue in the bilateral retroareolar breasts more prominent in the
right retroareolar region relative to the left retroareolar region.
IMPRESSION: Probable benign findings.

RECOMMENDATION:
Six-month follow-up mammogram and ultrasound of right breast.

I have discussed the findings and recommendations with the patient.
Results were also provided in writing at the conclusion of the
visit. If applicable, a reminder letter will be sent to the patient
regarding the next appointment.

BI-RADS CATEGORY  3: Probably benign.

## 2017-04-17 IMAGING — US US BREAST*L* LIMITED INC AXILLA
1 series · 5 of 5 positions shown · non-contrast
Comparison: Previous exam(s).

CLINICAL DATA: Palpable lump right breast

EXAM:
2D DIGITAL DIAGNOSTIC BILATERAL MAMMOGRAM WITH CAD AND ADJUNCT TOMO
ULTRASOUND BILATERAL BREAST

[Series 1: us breast*left* limited inc axilla · 0.04mm/px · 5 of 5 slices shown]
[im 1/5]
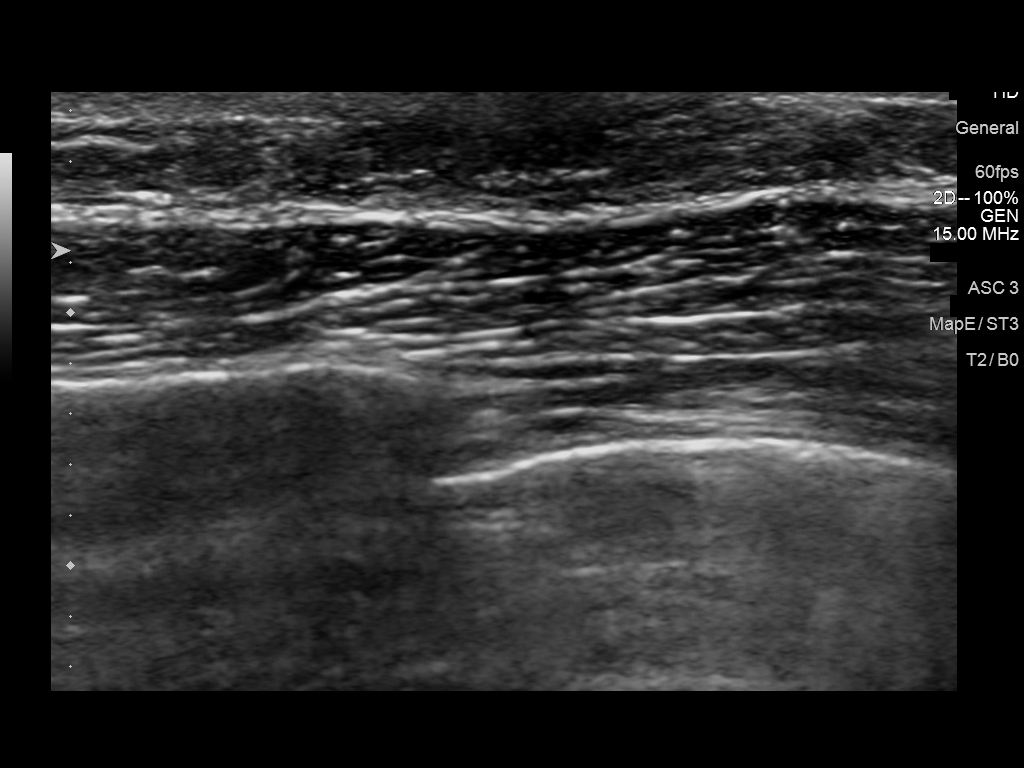
[im 2/5]
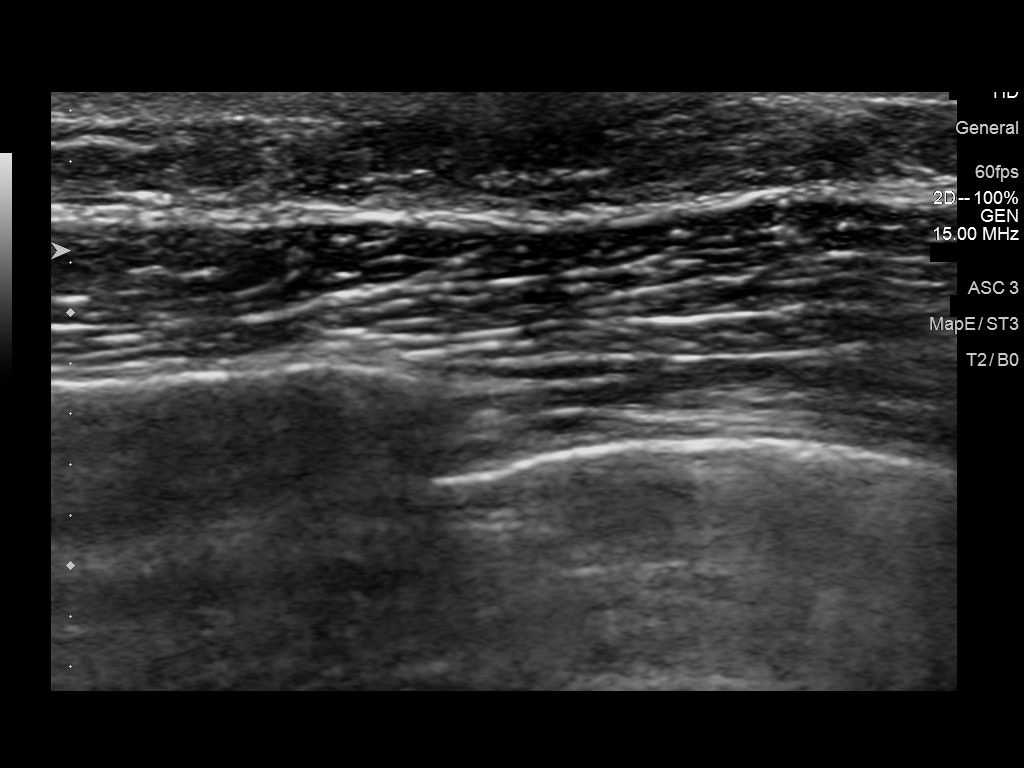
[im 3/5]
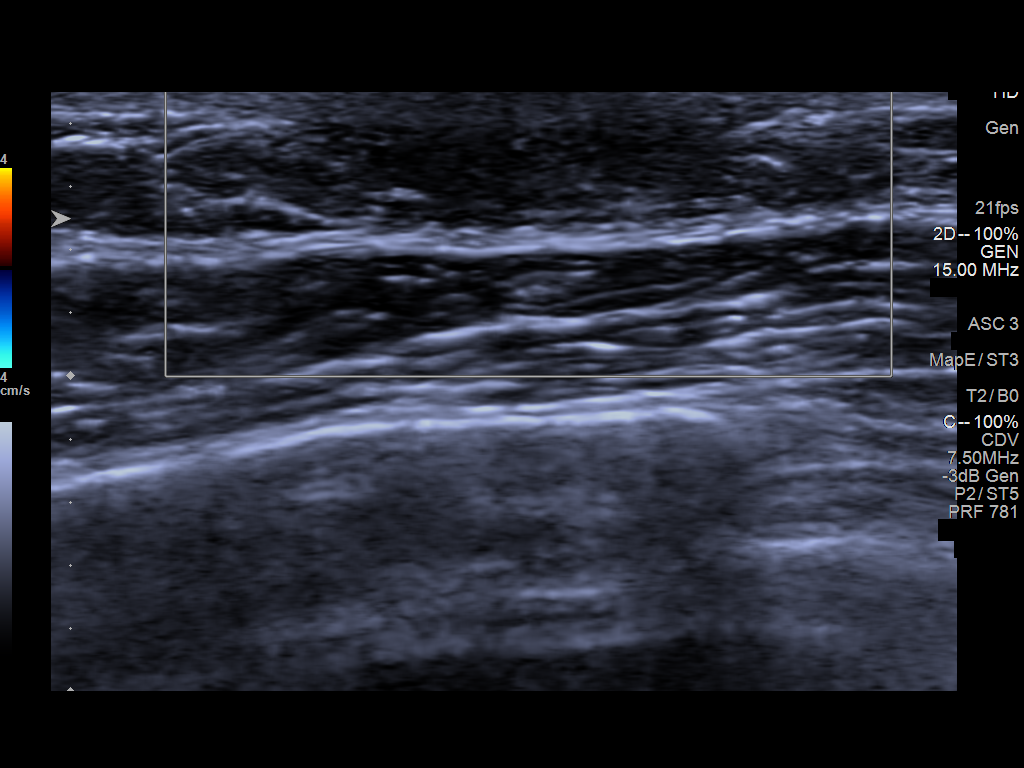
[im 4/5]
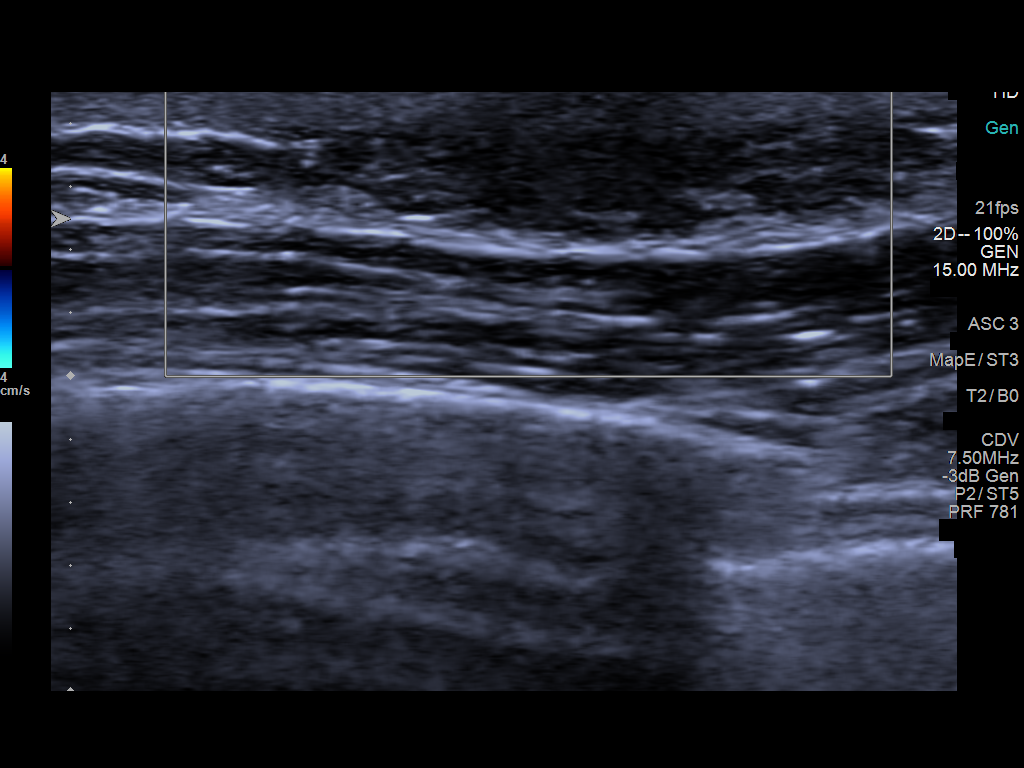
[im 5/5]
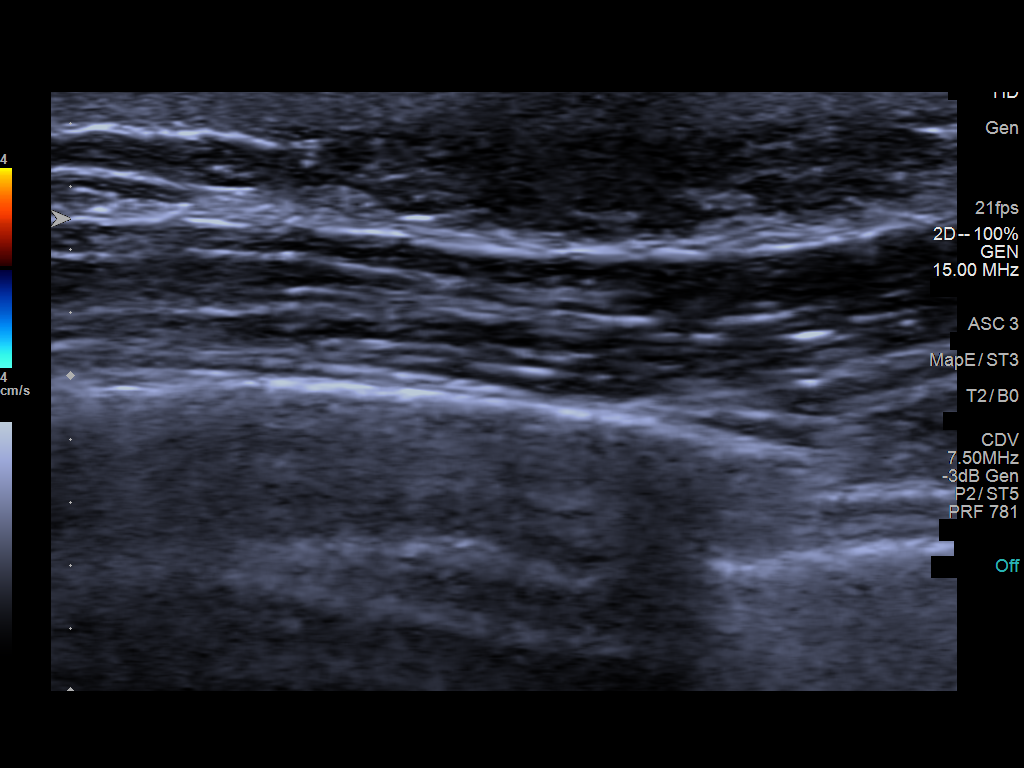

[5 of 5 positions shown; findings below may reference images not displayed]

ACR Breast Density Category b: There are scattered areas of
fibroglandular density.
FINDINGS: Cc and MLO views of bilateral breasts, spot tangential view of right
breast are submitted. There are bilateral symmetrical densities in
the retroareolar breast. No suspicious microcalcifications are
identified.

Mammographic images were processed with CAD.

Targeted ultrasound is performed, showing probable fibroglandular
tissue in the bilateral retroareolar breasts more prominent in the
right retroareolar region relative to the left retroareolar region.
IMPRESSION: Probable benign findings.

RECOMMENDATION:
Six-month follow-up mammogram and ultrasound of right breast.

I have discussed the findings and recommendations with the patient.
Results were also provided in writing at the conclusion of the
visit. If applicable, a reminder letter will be sent to the patient
regarding the next appointment.

BI-RADS CATEGORY  3: Probably benign.

## 2017-06-12 ENCOUNTER — Emergency Department
Admission: EM | Admit: 2017-06-12 | Discharge: 2017-06-12 | Disposition: A | Discharge status: Home / Self Care | Payer: BLUE CROSS/BLUE SHIELD | Attending: Emergency Medicine | Admitting: Emergency Medicine

## 2017-06-12 ENCOUNTER — Emergency Department: Payer: BLUE CROSS/BLUE SHIELD

## 2017-06-12 ENCOUNTER — Encounter: Payer: Self-pay | Admitting: Emergency Medicine

## 2017-06-12 DIAGNOSIS — J449 Chronic obstructive pulmonary disease, unspecified: Secondary | ICD-10-CM | POA: Diagnosis not present

## 2017-06-12 DIAGNOSIS — F17200 Nicotine dependence, unspecified, uncomplicated: Secondary | ICD-10-CM | POA: Diagnosis not present

## 2017-06-12 DIAGNOSIS — J189 Pneumonia, unspecified organism: Secondary | ICD-10-CM | POA: Diagnosis not present

## 2017-06-12 DIAGNOSIS — R42 Dizziness and giddiness: Secondary | ICD-10-CM | POA: Diagnosis present

## 2017-06-12 LAB — GLUCOSE, CAPILLARY: Glucose-Capillary: 93 mg/dL (ref 65–99)

## 2017-06-12 LAB — URINALYSIS, COMPLETE (UACMP) WITH MICROSCOPIC
Bacteria, UA: NONE SEEN
Bilirubin Urine: NEGATIVE
GLUCOSE, UA: NEGATIVE mg/dL
HGB URINE DIPSTICK: NEGATIVE
KETONES UR: NEGATIVE mg/dL
LEUKOCYTES UA: NEGATIVE
Nitrite: NEGATIVE
PROTEIN: NEGATIVE mg/dL
RBC / HPF: NONE SEEN RBC/hpf (ref 0–5)
SQUAMOUS EPITHELIAL / LPF: NONE SEEN
Specific Gravity, Urine: 1.002 — ABNORMAL LOW (ref 1.005–1.030)
WBC, UA: NONE SEEN WBC/hpf (ref 0–5)
pH: 5 (ref 5.0–8.0)

## 2017-06-12 LAB — CBC
HCT: 41.3 % (ref 40.0–52.0)
HEMOGLOBIN: 14.1 g/dL (ref 13.0–18.0)
MCH: 30 pg (ref 26.0–34.0)
MCHC: 34.3 g/dL (ref 32.0–36.0)
MCV: 87.6 fL (ref 80.0–100.0)
PLATELETS: 182 10*3/uL (ref 150–440)
RBC: 4.71 MIL/uL (ref 4.40–5.90)
RDW: 14.9 % — ABNORMAL HIGH (ref 11.5–14.5)
WBC: 5.5 10*3/uL (ref 3.8–10.6)

## 2017-06-12 LAB — BASIC METABOLIC PANEL
ANION GAP: 14 (ref 5–15)
BUN: 6 mg/dL (ref 6–20)
CHLORIDE: 99 mmol/L — AB (ref 101–111)
CO2: 22 mmol/L (ref 22–32)
CREATININE: 0.67 mg/dL (ref 0.61–1.24)
Calcium: 9.1 mg/dL (ref 8.9–10.3)
GFR calc Af Amer: >60 mL/min (ref >60)
GFR calc non Af Amer: >60 mL/min (ref >60)
Glucose, Bld: 91 mg/dL (ref 65–99)
Potassium: 3.9 mmol/L (ref 3.5–5.1)
SODIUM: 135 mmol/L (ref 135–145)

## 2017-06-12 MED ORDER — LEVOFLOXACIN 750 MG PO TABS
750.0000 mg | ORAL_TABLET | Freq: Once | ORAL | Status: AC
  Administered 2017-06-12: 750 mg via ORAL
  Filled 2017-06-12: qty 1

## 2017-06-12 MED ORDER — SODIUM CHLORIDE 0.9 % IV BOLUS (SEPSIS)
1000.0000 mL | Freq: Once | INTRAVENOUS | Status: AC
  Administered 2017-06-12: 1000 mL via INTRAVENOUS

## 2017-06-12 MED ORDER — LEVOFLOXACIN 750 MG PO TABS: 750.0000 mg | ORAL_TABLET | Freq: Every day | ORAL | 0 refills | Status: AC

## 2017-06-12 NOTE — Discharge Instructions (Signed)
Please seek medical attention for any high fevers, chest pain, shortness of breath, change in behavior, persistent vomiting, bloody stool or any other new or concerning symptoms.  

## 2017-06-12 NOTE — ED Triage Notes (Signed)
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  Patient states he had to stop the tractor and go home.  Patient states the dizziness and nausea has continued into today.  Denies any chest pain, or current shortness of breath.  Patient ambulatory to triage and has even and unlabored respirations in triage.  Patient in NAD at this time. Patient denies consuming enough fluids while working outside.

## 2017-06-12 NOTE — ED Provider Notes (Signed)
[facility] Emergency Department Provider Note   ____________________________________________   I have reviewed the triage vital signs and the nursing notes.   HISTORY  Chief Complaint Nausea and Dizziness   History limited by: Poor Historian   HPI Andre Rios is a 66 y.o. male who presents to the emergency department today after having an episode while working on his tractor yesterday. Patient states he was out in the hot sun on his tractor. He states he became lightheaded, weak, dizzy. He felt very hot and started sweating. The patient states that he was trying to drink water. Felt slightly better throughout the day however started feeling worse again today. At the time of my examination he states it has based and he feels back to baseline.    Past Medical History:  Diagnosis Date  . Alcohol abuse   . COPD (chronic obstructive pulmonary disease) ([place])   . Pneumonia   . Seasonal allergies     Patient Active Problem List   Diagnosis Date Noted  . 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 Alcohol abuse 08/08/2016  . Involuntary commitment 08/08/2016  . 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) 86/76/7209    Past Surgical History:  Procedure Laterality Date  . EYE SURGERY      Prior to Admission medications   Medication Sig Start Date End Date Taking? Authorizing Provider  albuterol (PROVENTIL HFA;VENTOLIN HFA) 108 (90 Base) MCG/ACT inhaler Inhale 2 puffs into the lungs every 4 (four) hours as needed for wheezing or shortness of breath. 12/11/16   Cuthriell, Charline Bills, PA-C  azithromycin (ZITHROMAX Z-PAK) 250 MG tablet Take 2 tablets (500 mg) on  Day 1,  followed by 1 tablet (250 mg) once daily on Days 2 through 5. Patient not taking: Reported on 01/31/2017 12/11/16   Cuthriell, Charline Bills, PA-C  predniSONE (DELTASONE) 50 MG tablet Take 1 tablet (50 mg total) by mouth daily with breakfast. Patient not taking: Reported on 01/31/2017 12/11/16   Cuthriell, Charline Bills, PA-C     Allergies Patient has no known allergies.  No family history on file.  Social History Social History  Substance Use Topics  . Smoking status: Current Every Day Smoker  . Smokeless tobacco: Never Used  . Alcohol use 3.0 oz/week    5 Cans of beer per week     Comment: several times a week    Review of Systems Constitutional: No fever/chills Eyes: No visual changes. ENT: No sore throat. Cardiovascular: Denies chest pain. Respiratory: Positive for shortness of breath Gastrointestinal: No abdominal pain.  No nausea, no vomiting.  No diarrhea.   Genitourinary: Negative for dysuria. Musculoskeletal: Negative for back pain. Skin: Negative for rash. Neurological: Negative for headaches, focal weakness or numbness.  ____________________________________________   PHYSICAL EXAM:  VITAL SIGNS: ED Triage Vitals  Enc Vitals Group     BP 06/12/17 1338 116/77     Pulse Rate 06/12/17 1338 88     Resp 06/12/17 1338 20     Temp 06/12/17 1338 99 F (37.2 C)     Temp Source 06/12/17 1338 Oral     SpO2 06/12/17 1338 95 %     Weight 06/12/17 1339 130 lb (59 kg)     Height 06/12/17 1339 5\' 7"  (1.702 m)   Constitutional: Alert and oriented. Well appearing and in no distress. Eyes: Conjunctivae are normal.  ENT   Head: Normocephalic and atraumatic.   Nose: No congestion/rhinnorhea.   Mouth/Throat: Mucous membranes are moist.   Neck: No stridor. Hematological/Lymphatic/Immunilogical: No  cervical lymphadenopathy. Cardiovascular: Normal rate, regular rhythm.  No murmurs, rubs, or gallops.  Respiratory: Normal respiratory effort without tachypnea nor retractions. Breath sounds are clear and equal bilaterally. No wheezes/rales/rhonchi. Gastrointestinal: Soft and non tender. No rebound. No guarding.  Genitourinary: Deferred Musculoskeletal: Normal range of motion in all extremities. No lower extremity edema. Neurologic:  Normal speech and language. No gross focal neurologic  deficits are appreciated.  Skin:  Skin is warm, dry and intact. No rash noted. Psychiatric: Mood and affect are normal. Speech and behavior are normal. Patient exhibits appropriate insight and judgment.  ____________________________________________    LABS (pertinent positives/negatives)  Labs Reviewed  BASIC METABOLIC PANEL - Abnormal; Notable for the following:       Result Value   Chloride 99 (*)    All other components within normal limits  CBC - Abnormal; Notable for the following:    RDW 14.9 (*)    All other components within normal limits  URINALYSIS, COMPLETE (UACMP) WITH MICROSCOPIC - Abnormal; Notable for the following:    Color, Urine STRAW (*)    APPearance CLEAR (*)    Specific Gravity, Urine 1.002 (*)    All other components within normal limits  GLUCOSE, CAPILLARY  CBG MONITORING, ED     ____________________________________________   EKG  I, Nance Pear, attending physician, personally viewed and interpreted this EKG  EKG Time: 1348 Rate: 80 Rhythm: normal sinus rhythm Axis: normal axis Intervals: qtc 417 QRS: narrow ST changes: no st elevation Impression: abnormal ekg   ____________________________________________    RADIOLOGY  CXR IMPRESSION: 1. Increased right upper lung reticulonodular markings suspicious for acute infectious exacerbation. No pleural effusion. 2. Underlying chronic lung disease with hyperinflation.  CT chest IMPRESSION:  New mild reticular and possible tree-in-bud opacities within  portions of the right upper and right lower lobes -likely  representing infection.    No other acute abnormality.    Coronary artery disease and evidence of pulmonary arterial  hypertension.    ____________________________________________   PROCEDURES  Procedures  ____________________________________________   INITIAL IMPRESSION / ASSESSMENT AND PLAN / ED COURSE  Pertinent labs & imaging results that were available  during my care of the patient were reviewed by me and considered in my medical decision making (see chart for details).  Patient's workup here concerning for pneumonia. Will give patient first dose of antibiotics and discharge with further antibiotics.  ____________________________________________   FINAL CLINICAL IMPRESSION(S) / ED DIAGNOSES  Final diagnoses:  Community acquired pneumonia, unspecified laterality     Note: This dictation was prepared with Sales executive. Any transcriptional errors that result from this process are unintentional     Nance Pear, MD 06/12/17 [id number]

## 2017-08-09 ENCOUNTER — Observation Stay
Admission: EM | Admit: 2017-08-09 | Discharge: 2017-08-10 | Disposition: A | Discharge status: Home / Self Care | Payer: BLUE CROSS/BLUE SHIELD | Attending: Internal Medicine | Admitting: Internal Medicine

## 2017-08-09 ENCOUNTER — Emergency Department: Payer: BLUE CROSS/BLUE SHIELD

## 2017-08-09 ENCOUNTER — Encounter: Payer: Self-pay | Admitting: Emergency Medicine

## 2017-08-09 DIAGNOSIS — J45909 Unspecified asthma, uncomplicated: Secondary | ICD-10-CM | POA: Diagnosis not present

## 2017-08-09 DIAGNOSIS — Z23 Encounter for immunization: Secondary | ICD-10-CM | POA: Diagnosis not present

## 2017-08-09 DIAGNOSIS — J449 Chronic obstructive pulmonary disease, unspecified: Secondary | ICD-10-CM | POA: Diagnosis not present

## 2017-08-09 DIAGNOSIS — F1010 Alcohol abuse, uncomplicated: Secondary | ICD-10-CM | POA: Diagnosis not present

## 2017-08-09 DIAGNOSIS — J189 Pneumonia, unspecified organism: Secondary | ICD-10-CM | POA: Diagnosis not present

## 2017-08-09 DIAGNOSIS — Z9889 Other specified postprocedural states: Secondary | ICD-10-CM | POA: Insufficient documentation

## 2017-08-09 DIAGNOSIS — F17200 Nicotine dependence, unspecified, uncomplicated: Secondary | ICD-10-CM | POA: Insufficient documentation

## 2017-08-09 DIAGNOSIS — Z79899 Other long term (current) drug therapy: Secondary | ICD-10-CM | POA: Diagnosis not present

## 2017-08-09 HISTORY — DX: Unspecified asthma, uncomplicated: J45.909

## 2017-08-09 LAB — COMPREHENSIVE METABOLIC PANEL
ALT: 100 U/L — AB (ref 17–63)
ANION GAP: 11 (ref 5–15)
AST: 250 U/L — ABNORMAL HIGH (ref 15–41)
Albumin: 4 g/dL (ref 3.5–5.0)
Alkaline Phosphatase: 119 U/L (ref 38–126)
BUN: 7 mg/dL (ref 6–20)
CHLORIDE: 102 mmol/L (ref 101–111)
CO2: 26 mmol/L (ref 22–32)
Calcium: 9.8 mg/dL (ref 8.9–10.3)
Creatinine, Ser: 0.66 mg/dL (ref 0.61–1.24)
GFR calc non Af Amer: >60 mL/min (ref >60)
Glucose, Bld: 176 mg/dL — ABNORMAL HIGH (ref 65–99)
Potassium: 3.4 mmol/L — ABNORMAL LOW (ref 3.5–5.1)
SODIUM: 139 mmol/L (ref 135–145)
Total Bilirubin: 1 mg/dL (ref 0.3–1.2)
Total Protein: 7.8 g/dL (ref 6.5–8.1)

## 2017-08-09 LAB — DIFFERENTIAL
BASOS ABS: 0 10*3/uL (ref 0–0.1)
Basophils Relative: 1 %
Eosinophils Absolute: 0 10*3/uL (ref 0–0.7)
Eosinophils Relative: 0 %
LYMPHS ABS: 0.6 10*3/uL — AB (ref 1.0–3.6)
LYMPHS PCT: 14 %
Monocytes Absolute: 0.3 10*3/uL (ref 0.2–1.0)
Monocytes Relative: 7 %
NEUTROS PCT: 78 %
Neutro Abs: 3.4 10*3/uL (ref 1.4–6.5)

## 2017-08-09 LAB — CBC
HCT: 42.5 % (ref 40.0–52.0)
Hemoglobin: 14.4 g/dL (ref 13.0–18.0)
MCH: 30.2 pg (ref 26.0–34.0)
MCHC: 33.9 g/dL (ref 32.0–36.0)
MCV: 88.9 fL (ref 80.0–100.0)
PLATELETS: 116 10*3/uL — AB (ref 150–440)
RBC: 4.78 MIL/uL (ref 4.40–5.90)
RDW: 14.3 % (ref 11.5–14.5)
WBC: 4.7 10*3/uL (ref 3.8–10.6)

## 2017-08-09 LAB — URINALYSIS, COMPLETE (UACMP) WITH MICROSCOPIC
BACTERIA UA: NONE SEEN
BILIRUBIN URINE: NEGATIVE
Glucose, UA: NEGATIVE mg/dL
Hgb urine dipstick: NEGATIVE
Ketones, ur: 5 mg/dL — AB
LEUKOCYTES UA: NEGATIVE
NITRITE: NEGATIVE
Protein, ur: NEGATIVE mg/dL
RBC / HPF: NONE SEEN RBC/hpf (ref 0–5)
Specific Gravity, Urine: 1.042 — ABNORMAL HIGH (ref 1.005–1.030)
WBC UA: NONE SEEN WBC/hpf (ref 0–5)
pH: 7 (ref 5.0–8.0)

## 2017-08-09 LAB — TROPONIN I: Troponin I: <0.03 ng/mL (ref <0.03)

## 2017-08-09 MED ORDER — LORAZEPAM 2 MG/ML IJ SOLN: 1.0000 mg | Freq: Four times a day (QID) | INTRAMUSCULAR | Status: DC | PRN

## 2017-08-09 MED ORDER — SODIUM CHLORIDE 0.9 % IV BOLUS (SEPSIS)
1000.0000 mL | Freq: Once | INTRAVENOUS | Status: AC
  Administered 2017-08-09: 1000 mL via INTRAVENOUS

## 2017-08-09 MED ORDER — ADULT MULTIVITAMIN W/MINERALS CH
1.0000 | ORAL_TABLET | Freq: Every day | ORAL | Status: DC
  Administered 2017-08-10: 1 via ORAL
  Filled 2017-08-09: qty 1

## 2017-08-09 MED ORDER — PNEUMOCOCCAL VAC POLYVALENT 25 MCG/0.5ML IJ INJ
0.5000 mL | INJECTION | INTRAMUSCULAR | Status: AC
  Administered 2017-08-10: 0.5 mL via INTRAMUSCULAR
  Filled 2017-08-09: qty 0.5

## 2017-08-09 MED ORDER — IOPAMIDOL (ISOVUE-370) INJECTION 76%
60.0000 mL | Freq: Once | INTRAVENOUS | Status: AC | PRN
  Administered 2017-08-09: 60 mL via INTRAVENOUS

## 2017-08-09 MED ORDER — IBUPROFEN 400 MG PO TABS: 400.0000 mg | ORAL_TABLET | Freq: Four times a day (QID) | ORAL | Status: DC | PRN

## 2017-08-09 MED ORDER — FOLIC ACID 1 MG PO TABS
1.0000 mg | ORAL_TABLET | Freq: Every day | ORAL | Status: DC
  Administered 2017-08-10: 1 mg via ORAL
  Filled 2017-08-09: qty 1

## 2017-08-09 MED ORDER — ALBUTEROL SULFATE (2.5 MG/3ML) 0.083% IN NEBU: 3.0000 mL | INHALATION_SOLUTION | RESPIRATORY_TRACT | Status: DC | PRN

## 2017-08-09 MED ORDER — DEXTROSE 5 % IV SOLN
2.0000 g | Freq: Once | INTRAVENOUS | Status: AC
  Administered 2017-08-09: 2 g via INTRAVENOUS
  Filled 2017-08-09: qty 2

## 2017-08-09 MED ORDER — POTASSIUM CHLORIDE CRYS ER 20 MEQ PO TBCR
40.0000 meq | EXTENDED_RELEASE_TABLET | ORAL | Status: AC
  Administered 2017-08-09: 40 meq via ORAL
  Filled 2017-08-09: qty 2

## 2017-08-09 MED ORDER — AZITHROMYCIN 500 MG IV SOLR
500.0000 mg | Freq: Once | INTRAVENOUS | Status: AC
  Administered 2017-08-09: 500 mg via INTRAVENOUS
  Filled 2017-08-09: qty 500

## 2017-08-09 MED ORDER — ALBUTEROL SULFATE (2.5 MG/3ML) 0.083% IN NEBU
5.0000 mg | INHALATION_SOLUTION | Freq: Once | RESPIRATORY_TRACT | Status: AC
  Administered 2017-08-09: 5 mg via RESPIRATORY_TRACT
  Filled 2017-08-09: qty 6

## 2017-08-09 MED ORDER — VITAMIN B-1 100 MG PO TABS
100.0000 mg | ORAL_TABLET | Freq: Every day | ORAL | Status: DC
  Administered 2017-08-10: 100 mg via ORAL
  Filled 2017-08-09: qty 1

## 2017-08-09 MED ORDER — DEXTROSE 5 % IV SOLN
500.0000 mg | INTRAVENOUS | Status: DC
  Filled 2017-08-09: qty 500

## 2017-08-09 MED ORDER — THIAMINE HCL 100 MG/ML IJ SOLN
100.0000 mg | Freq: Every day | INTRAMUSCULAR | Status: DC
  Filled 2017-08-09: qty 2

## 2017-08-09 MED ORDER — DEXTROSE 5 % IV SOLN
1.0000 g | INTRAVENOUS | Status: DC
  Filled 2017-08-09: qty 10

## 2017-08-09 MED ORDER — ENOXAPARIN SODIUM 40 MG/0.4ML ~~LOC~~ SOLN
40.0000 mg | SUBCUTANEOUS | Status: DC
  Administered 2017-08-09: 40 mg via SUBCUTANEOUS
  Filled 2017-08-09: qty 0.4

## 2017-08-09 MED ORDER — LORAZEPAM 1 MG PO TABS: 1.0000 mg | ORAL_TABLET | Freq: Four times a day (QID) | ORAL | Status: DC | PRN

## 2017-08-09 MED ORDER — ACETAMINOPHEN 500 MG PO TABS: 500.0000 mg | ORAL_TABLET | Freq: Four times a day (QID) | ORAL | Status: DC | PRN

## 2017-08-09 MED ORDER — NICOTINE 21 MG/24HR TD PT24
21.0000 mg | MEDICATED_PATCH | Freq: Every day | TRANSDERMAL | Status: DC
  Administered 2017-08-09 – 2017-08-10 (×2): 21 mg via TRANSDERMAL
  Filled 2017-08-09 (×2): qty 1

## 2017-08-09 NOTE — Progress Notes (Signed)
Pt admitted to [place] floor Room 136. Pt calm, cooperative, no distress noted. VS stable. Family at bedside. Navigators and assessment completed, pt oriented to floor and equipment.

## 2017-08-09 NOTE — ED Notes (Signed)
Patient transported to CT at this time. 

## 2017-08-09 NOTE — ED Triage Notes (Signed)
Patient from home via POV with c/o SHOB and tremors. Patient states that he was recently diagnosed with pneumonia from this facility but could not afford to fill his ABX.

## 2017-08-09 NOTE — H&P (Signed)
[facility] [place] NAME: Andre Rios    MR#:  [id number]  DATE OF BIRTH:  [birth year]  DATE OF ADMISSION:  08/09/2017  PRIMARY CARE PHYSICIAN: [facility] [place]   REQUESTING/REFERRING PHYSICIAN: Rip Harbour  CHIEF COMPLAINT:   Cough and shortness of breath HISTORY OF PRESENT ILLNESS:  Andre Rios  is a 66 y.o. male with a known history of COPD, allergic rhinitis, alcoholic abuse and tobacco abuse is presenting to the ED with a chief complaint of shortness of breath associated with productive cough CT chest has revealed no pulmonary embolism but left upper lobe pneumonia and right middle lobe nodule. Patient is started on IV antibiotics and hospitalist team is called to admit the patient. Patient admits drinking beer on daily basis last drink was yesterday. Patient's sister is at bedside  PAST MEDICAL HISTORY:   Past Medical History:  Diagnosis Date  . Alcohol abuse   . Asthma   . COPD (chronic obstructive pulmonary disease) ([place])   . Pneumonia   . Seasonal allergies     PAST SURGICAL HISTOIRY:   Past Surgical History:  Procedure Laterality Date  . EYE SURGERY      SOCIAL HISTORY:   Social History  Substance Use Topics  . Smoking status: Current Every Day Smoker  . Smokeless tobacco: Never Used  . Alcohol use 3.0 oz/week    5 Cans of beer per week     Comment: several times a week    FAMILY HISTORY:  History reviewed. No pertinent family history.  DRUG ALLERGIES:  No Known Allergies  REVIEW OF SYSTEMS:  CONSTITUTIONAL: No fever, fatigue or weakness.  EYES: No blurred or double vision.  EARS, NOSE, AND THROAT: No tinnitus or ear pain.  RESPIRATORY: reporting productive cough, shortness of breath,denies wheezing or hemoptysis.  CARDIOVASCULAR: No chest pain, orthopnea, edema.  GASTROINTESTINAL: No nausea, vomiting, diarrhea or abdominal pain.  GENITOURINARY: No dysuria, hematuria.   ENDOCRINE: No polyuria, nocturia,  HEMATOLOGY: No anemia, easy bruising or bleeding SKIN: No rash or lesion. MUSCULOSKELETAL: No joint pain or arthritis.   NEUROLOGIC: No tingling, numbness, weakness.  PSYCHIATRY: No anxiety or depression.   MEDICATIONS AT HOME:   Prior to Admission medications   Medication Sig Start Date End Date Taking? Authorizing Provider  acetaminophen (TYLENOL) 500 MG tablet Take 500 mg by mouth every 6 (six) hours as needed.   Yes [provider]  albuterol (PROVENTIL HFA;VENTOLIN HFA) 108 (90 Base) MCG/ACT inhaler Inhale 2 puffs into the lungs every 4 (four) hours as needed for wheezing or shortness of breath. 12/11/16  Yes Cuthriell, Charline Bills, PA-C  ibuprofen (ADVIL,MOTRIN) 200 MG tablet Take 200 mg by mouth every 6 (six) hours as needed.   Yes [provider]  Phenyleph-Doxylamine-DM-APAP (ALKA SELTZER PLUS PO) Take 1 tablet by mouth daily as needed.   Yes [provider]      VITAL SIGNS:  Blood pressure (!) 147/89, pulse 84, temperature 99.2 F (37.3 C), temperature source Oral, resp. rate 18, height 5\' 6"  (1.676 m), weight 58.1 kg (128 lb), SpO2 97 %.  PHYSICAL EXAMINATION:  GENERAL:  66 y.o.-year-old patient lying in the bed with no acute distress.  EYES: Pupils equal, round, reactive to light and accommodation. No scleral icterus. Extraocular muscles intact.  HEENT: Head atraumatic, normocephalic. Oropharynx and nasopharynx clear.  NECK:  Supple, no jugular venous distention. No thyroid enlargement, no tenderness.  LUNGS: Normal breath sounds bilaterally except left  upper lobe crepitations, no wheezing, rales,rhonchi or  No use of accessory muscles of respiration.  CARDIOVASCULAR: S1, S2 normal. No murmurs, rubs, or gallops.  ABDOMEN: Soft, nontender, nondistended. Bowel sounds present.   EXTREMITIES: No pedal edema, cyanosis, or clubbing.  NEUROLOGIC: tremors are presentCranial nerves II through XII are intact. Muscle  strength 5/5 in all extremities. Sensation intact. Gait not checked.  PSYCHIATRIC: The patient is alert and oriented x 3.  SKIN: No obvious rash, lesion, or ulcer.   LABORATORY PANEL:   CBC  Recent Labs Lab 08/09/17 1211  WBC 4.7  HGB 14.4  HCT 42.5  PLT 116*   ------------------------------------------------------------------------------------------------------------------  Chemistries   Recent Labs Lab 08/09/17 1211  NA 139  K 3.4*  CL 102  CO2 26  GLUCOSE 176*  BUN 7  CREATININE 0.66  CALCIUM 9.8  AST 250*  ALT 100*  ALKPHOS 119  BILITOT 1.0   ------------------------------------------------------------------------------------------------------------------  Cardiac Enzymes  Recent Labs Lab 08/09/17 1211  TROPONINI <0.03   ------------------------------------------------------------------------------------------------------------------  RADIOLOGY:  Dg Chest 2 View  Result Date: 08/09/2017 CLINICAL DATA:  Nonproductive cough and shortness breath for 1 week. COPD. EXAM: CHEST  2 VIEW COMPARISON:  06/12/2017 FINDINGS: The heart size and mediastinal contours are within normal limits. Severe emphysema again demonstrated. No evidence of pulmonary infiltrate or edema. No evidence of pleural effusion. IMPRESSION: Severe emphysema.  No active lung disease. Electronically Signed   By: Earle Gell M.D.   On: 08/09/2017 12:56   Ct Angio Chest Pe W And/or Wo Contrast  Result Date: 08/09/2017 CLINICAL DATA:  Shortness of breath. Emphysema. Clinical suspicion for pulmonary embolism. EXAM: CT ANGIOGRAPHY CHEST WITH CONTRAST TECHNIQUE: Multidetector CT imaging of the chest was performed using the standard protocol during bolus administration of intravenous contrast. Multiplanar CT image reconstructions and MIPs were obtained to evaluate the vascular anatomy. CONTRAST:  60 mL Isovue 370 COMPARISON:  06/12/2017 FINDINGS: Cardiovascular: Satisfactory opacification of pulmonary  arteries noted, and no pulmonary emboli identified. No evidence of thoracic aortic dissection or aneurysm. Aortic atherosclerosis. Mediastinum/Nodes: No masses or pathologically enlarged lymph nodes identified. Lungs/Pleura: Moderate emphysema again demonstrated with biapical blebs. New patchy airspace disease is seen within the anterior left upper lobe. New ill-defined nodular density is also seen in the lateral right middle lobe measuring approximately 9 mm. These findings are consistent with pneumonia or other inflammatory etiology. No evidence of pleural effusion. Upper abdomen: Diffuse hepatic steatosis again noted. Musculoskeletal: No suspicious bone lesions identified. Review of the MIP images confirms the above findings. IMPRESSION: No evidence of pulmonary embolism. New patchy airspace disease in anterior left upper lobe and sub-cm ill-defined nodular density in right middle lobe, consistent with infectious or inflammatory etiology. Aortic Atherosclerosis (ICD10-I70.0) and Emphysema (ICD10-J43.9). Hepatic steatosis. Electronically Signed   By: Earle Gell M.D.   On: 08/09/2017 14:07    EKG:   Orders placed or performed during the hospital encounter of 08/09/17  . ED EKG  . ED EKG    IMPRESSION AND PLAN:   Andre Rios  is a 66 y.o. male with a known history of COPD, allergic rhinitis, alcoholic abuse and tobacco abuse is presenting to the ED with a chief complaint of shortness of breath associated with productive cough CT chest has revealed no pulmonary embolism but left upper lobe pneumonia and right middle lobe nodule  #COMMUNITY-ACQUIRED PNEUMONIA-LEFT UPPER LOBE Admit to MedSurg unit IV Rocephin and azithromycin IV fluids Nebulizer treatments as needed for shortness of breath  #right middle lobe  pulmonary nodule Outpatient follow-up with primary care physician and if needed pulmonology follow-up  #chronic history of COPD Currently no exacerbation, provide nebulizer treatments as  needed counseled patient to quit smoking   # alcohol abuse Patient will be benefited with outpatient alcohol AA Alcohol withdrawal protocol  #tobacco abuse disorder Counseled patient to quit smoking for 5 minutes. Patient verbalized understanding and Considering to quit Agreeable with nicotine patch  DVT prophylaxis with Lovenox subcutaneous, with close monitoring of platelet count, as patient has thrombocytopenia with platelet count 1 16,000    All the records are reviewed and case discussed with ED provider. Management plans discussed with the patient, family and they are in agreement.  CODE STATUS: fc,sister HCPOA   TOTAL TIME TAKING CARE OF THIS PATIENT: 45  minutes.   Note: This dictation was prepared with Dragon dictation along with smaller phrase technology. Any transcriptional errors that result from this process are unintentional.  Nicholes Mango M.D on 08/09/2017 at 5:14 PM  Between 7am to 6pm - Pager - [phone]  After 6pm go to [email or web address] - password [email or web address] [facility]  Office  [phone]  CC: Primary care physician; [facility] [place]

## 2017-08-09 NOTE — ED Notes (Signed)
Patient transported to X-ray at this time 

## 2017-08-09 NOTE — ED Provider Notes (Signed)
[facility] Emergency Department Provider Note   ____________________________________________   First MD Initiated Contact with Patient 08/09/17 1238     (approximate)  I have reviewed the triage vital signs and the nursing notes.   HISTORY  Chief Complaint Tremors and Shortness of Breath    HPI Andre Rios is a 66 y.o. male who reports she's had episodes of sweating and shakiness and felt a little bit lightheaded today. Currently he is feeling much better. He's had some cough productive of some whitish phlegm. He has not run a fever that he is aware of.He is not drinking anymore than he usually does.   Past Medical History:  Diagnosis Date  . Alcohol abuse   . Asthma   . COPD (chronic obstructive pulmonary disease) ([place])   . Pneumonia   . Seasonal allergies     Patient Active Problem List   Diagnosis Date Noted  . 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 Alcohol abuse 08/08/2016  . Involuntary commitment 08/08/2016  . Alcoholic dementia ([place]) 42/59/5638    Past Surgical History:  Procedure Laterality Date  . EYE SURGERY      Prior to Admission medications   Medication Sig Start Date End Date Taking? Authorizing Provider  albuterol (PROVENTIL HFA;VENTOLIN HFA) 108 (90 Base) MCG/ACT inhaler Inhale 2 puffs into the lungs every 4 (four) hours as needed for wheezing or shortness of breath. 12/11/16   Cuthriell, Charline Bills, PA-C  azithromycin (ZITHROMAX Z-PAK) 250 MG tablet Take 2 tablets (500 mg) on  Day 1,  followed by 1 tablet (250 mg) once daily on Days 2 through 5. Patient not taking: Reported on 01/31/2017 12/11/16   Cuthriell, Charline Bills, PA-C  predniSONE (DELTASONE) 50 MG tablet Take 1 tablet (50 mg total) by mouth daily with breakfast. Patient not taking: Reported on 01/31/2017 12/11/16   Cuthriell, Charline Bills, PA-C    Allergies Patient has no known allergies.  History reviewed. No pertinent family history.  Social History Social  History  Substance Use Topics  . Smoking status: Current Every Day Smoker  . Smokeless tobacco: Never Used  . Alcohol use 3.0 oz/week    5 Cans of beer per week     Comment: several times a week    Review of Systems  Constitutional: No fever/chillsBut see history of present illness Eyes: No visual changes. ENT: No sore throat. Cardiovascular: Denies chest pain. Respiratory: Some occasional shortness of breath. Gastrointestinal: No abdominal pain.  No nausea, no vomiting.  No diarrhea.  No constipation. Genitourinary: Negative for dysuria. Musculoskeletal: Negative for back pain. Skin: Negative for rash. Neurological: Negative for headaches, focal weakness   ____________________________________________   PHYSICAL EXAM:  VITAL SIGNS: ED Triage Vitals  Enc Vitals Group     BP 08/09/17 1203 (!) 159/77     Pulse Rate 08/09/17 1203 99     Resp 08/09/17 1203 20     Temp 08/09/17 1203 99.2 F (37.3 C)     Temp Source 08/09/17 1203 Oral     SpO2 08/09/17 1203 96 %     Weight 08/09/17 1203 128 lb (58.1 kg)     Height 08/09/17 1203 5\' 6"  (1.676 m)     Head Circumference --      Peak Flow --      Pain Score 08/09/17 1202 0     Pain Loc --      Pain Edu? --      Excl. in [place]? --  Constitutional: Alert and oriented. Initially was very shaky later not so much so in no distress. Eyes: Conjunctivae are normal. PERRL. EOMI. Head: Atraumatic. Nose: No congestion/rhinnorhea. Mouth/Throat: Mucous membranes are moist.  Oropharynx non-erythematous. Neck: No stridor.   Cardiovascular: Normal rate, regular rhythm. Grossly normal heart sounds.  Good peripheral circulation. Respiratory: Normal respiratory effort.  No retractions. Lungs CTAB. Gastrointestinal: Soft and nontender. No distention. No abdominal bruits. No CVA tenderness. Musculoskeletal: No lower extremity tenderness nor edema.  No joint effusions. Neurologic:  Normal speech and language. No gross focal neurologic deficits  are appreciated. No gait instability. Skin:  Skin is warm, dry and intact. No rash noted. Psychiatric: Mood and affect are normal. Speech and behavior are normal.  ____________________________________________   LABS (all labs ordered are listed, but only abnormal results are displayed)  Labs Reviewed  CBC - Abnormal; Notable for the following:       Result Value   Platelets 116 (*)    All other components within normal limits  COMPREHENSIVE METABOLIC PANEL - Abnormal; Notable for the following:    Potassium 3.4 (*)    Glucose, Bld 176 (*)    AST 250 (*)    ALT 100 (*)    All other components within normal limits  DIFFERENTIAL - Abnormal; Notable for the following:    Lymphs Abs 0.6 (*)    All other components within normal limits  URINALYSIS, COMPLETE (UACMP) WITH MICROSCOPIC - Abnormal; Notable for the following:    Color, Urine YELLOW (*)    APPearance CLEAR (*)    Specific Gravity, Urine 1.042 (*)    Ketones, ur 5 (*)    Squamous Epithelial / LPF 0-5 (*)    All other components within normal limits  TROPONIN I   ____________________________________________  EKG EKG read and interpreted by me shows normal sinus rhythm at 98 normal axis QT has been prolonged  ____________________________________________  RADIOLOGY  Dg Chest 2 View  Result Date: 08/09/2017 CLINICAL DATA:  Nonproductive cough and shortness breath for 1 week. COPD. EXAM: CHEST  2 VIEW COMPARISON:  06/12/2017 FINDINGS: The heart size and mediastinal contours are within normal limits. Severe emphysema again demonstrated. No evidence of pulmonary infiltrate or edema. No evidence of pleural effusion. IMPRESSION: Severe emphysema.  No active lung disease. Electronically Signed   By: Earle Gell M.D.   On: 08/09/2017 12:56   Ct Angio Chest Pe W And/or Wo Contrast  Result Date: 08/09/2017 CLINICAL DATA:  Shortness of breath. Emphysema. Clinical suspicion for pulmonary embolism. EXAM: CT ANGIOGRAPHY CHEST WITH  CONTRAST TECHNIQUE: Multidetector CT imaging of the chest was performed using the standard protocol during bolus administration of intravenous contrast. Multiplanar CT image reconstructions and MIPs were obtained to evaluate the vascular anatomy. CONTRAST:  60 mL Isovue 370 COMPARISON:  06/12/2017 FINDINGS: Cardiovascular: Satisfactory opacification of pulmonary arteries noted, and no pulmonary emboli identified. No evidence of thoracic aortic dissection or aneurysm. Aortic atherosclerosis. Mediastinum/Nodes: No masses or pathologically enlarged lymph nodes identified. Lungs/Pleura: Moderate emphysema again demonstrated with biapical blebs. New patchy airspace disease is seen within the anterior left upper lobe. New ill-defined nodular density is also seen in the lateral right middle lobe measuring approximately 9 mm. These findings are consistent with pneumonia or other inflammatory etiology. No evidence of pleural effusion. Upper abdomen: Diffuse hepatic steatosis again noted. Musculoskeletal: No suspicious bone lesions identified. Review of the MIP images confirms the above findings. IMPRESSION: No evidence of pulmonary embolism. New patchy airspace disease in anterior left  upper lobe and sub-cm ill-defined nodular density in right middle lobe, consistent with infectious or inflammatory etiology. Aortic Atherosclerosis (ICD10-I70.0) and Emphysema (ICD10-J43.9). Hepatic steatosis. Electronically Signed   By: Earle Gell M.D.   On: 08/09/2017 14:07    ____________________________________________   PROCEDURES  Procedure(s) performed: Procedures  Critical Care performed:   ____________________________________________   INITIAL IMPRESSION / ASSESSMENT AND PLAN / ED COURSE  Pertinent labs & imaging results that were available during my care of the patient were reviewed by me and considered in my medical decision making (see chart for details).  Patient does have a pneumonia. He did not take his  antibiotics or even get his antibiotics filled last time he had a pneumonia. He also has a history of alcoholism and dementia. I think it might be safer to keep him in the hospital. I talked to him and his sister and sounds like they will be willing to stay.  Clinical Course as of Aug 09 1520  Sun Aug 09, 2017  1515 Monocytes Relative: 7 [PM]    Clinical Course User Index [PM] Nena Polio, MD     ____________________________________________   FINAL CLINICAL IMPRESSION(S) / ED DIAGNOSES  Final diagnoses:  Community acquired pneumonia, unspecified laterality      NEW MEDICATIONS STARTED DURING THIS VISIT:  New Prescriptions   No medications on file     Note:  This document was prepared using Dragon voice recognition software and may include unintentional dictation errors.    Nena Polio, MD 08/09/17 [phone]

## 2017-08-10 LAB — STREP PNEUMONIAE URINARY ANTIGEN: STREP PNEUMO URINARY ANTIGEN: NEGATIVE

## 2017-08-10 LAB — MAGNESIUM: Magnesium: 1.8 mg/dL (ref 1.7–2.4)

## 2017-08-10 MED ORDER — CEFUROXIME AXETIL 500 MG PO TABS: 500.0000 mg | ORAL_TABLET | Freq: Two times a day (BID) | ORAL | 0 refills | Status: DC

## 2017-08-10 MED ORDER — CEFUROXIME AXETIL 500 MG PO TABS
500.0000 mg | ORAL_TABLET | Freq: Two times a day (BID) | ORAL | Status: DC
  Administered 2017-08-10: 500 mg via ORAL
  Filled 2017-08-10 (×2): qty 1

## 2017-08-10 MED ORDER — AZITHROMYCIN 500 MG PO TABS
500.0000 mg | ORAL_TABLET | Freq: Every day | ORAL | Status: DC
  Administered 2017-08-10: 500 mg via ORAL
  Filled 2017-08-10: qty 1

## 2017-08-10 MED ORDER — AZITHROMYCIN 500 MG PO TABS: 500.0000 mg | ORAL_TABLET | Freq: Every day | ORAL | 0 refills | Status: DC

## 2017-08-10 NOTE — Progress Notes (Addendum)
Explained discharge instructions. Verified preferred pharmacy and explained to pick up prescriptions and take them as ordered. IV removed. Ready for discharge. Patient awaiting ride.  Ride here at 1500. Walked pt to visitors entrance.

## 2017-08-10 NOTE — Discharge Summary (Signed)
[name] NAME: Andre Rios    MR#:  [id number]  DATE OF BIRTH:  1951-07-27  DATE OF ADMISSION:  08/09/2017   ADMITTING PHYSICIAN: Nicholes Mango, MD  DATE OF DISCHARGE: 08/10/2017  PRIMARY CARE PHYSICIAN: [facility] [place]   ADMISSION DIAGNOSIS:   Community acquired pneumonia, unspecified laterality [J18.9]  DISCHARGE DIAGNOSIS:   Active Problems:   PNA (pneumonia)   SECONDARY DIAGNOSIS:   Past Medical History:  Diagnosis Date  . Alcohol abuse   . Asthma   . COPD (chronic obstructive pulmonary disease) ([place])   . Pneumonia   . Seasonal allergies     HOSPITAL COURSE:   66 year old male with past medical history significant for COPD not on home oxygen, ongoing smoking and alcohol abuse presents with productive cough and dyspnea.  #1 community acquired pneumonia-CT of the chest negative for PE but does have left upper lobe infiltrate. Blood Cultures Negative so Far. Started on Rocephin and Azithromycin. -Clinically Much Improved. WBCs within Normal Limits and No Fevers. -will Discharge on Ceftin and Azithromycin  #2 COPD-counseled against smoking. Received nicotine patch in the hospital -Not on home oxygen. Continue when necessary inhalers  #3 alcohol abuse-presented with tremors. Did not score any on CIWA scale. -Counseled.  Patient has been ambulating without any distress. Will be discharged home today   DISCHARGE CONDITIONS:   Guarded CONSULTS OBTAINED:    none  DRUG ALLERGIES:   No Known Allergies DISCHARGE MEDICATIONS:   Allergies as of 08/10/2017   No Known Allergies     Medication List    TAKE these medications   acetaminophen 500 MG tablet Commonly known as:  TYLENOL Take 500 mg by mouth every 6 (six) hours as needed.   albuterol 108 (90 Base) MCG/ACT inhaler Commonly known as:  PROVENTIL HFA;VENTOLIN HFA Inhale 2 puffs into the lungs every 4 (four) hours as  needed for wheezing or shortness of breath.   ALKA SELTZER PLUS PO Take 1 tablet by mouth daily as needed.   azithromycin 500 MG tablet Commonly known as:  ZITHROMAX Take 1 tablet (500 mg total) by mouth daily. X 4 more days   cefUROXime 500 MG tablet Commonly known as:  CEFTIN Take 1 tablet (500 mg total) by mouth 2 (two) times daily with a meal. X 7 days   ibuprofen 200 MG tablet Commonly known as:  ADVIL,MOTRIN Take 200 mg by mouth every 6 (six) hours as needed.            Discharge Care Instructions        Start     Ordered   08/11/17 0000  azithromycin (ZITHROMAX) 500 MG tablet  Daily     08/10/17 1101   08/10/17 0000  cefUROXime (CEFTIN) 500 MG tablet  2 times daily with meals     08/10/17 1101   08/10/17 0000  Diet - low sodium heart healthy     08/10/17 1101   08/10/17 0000  Activity as tolerated - No restrictions     08/10/17 1101       DISCHARGE INSTRUCTIONS:   1. PCP follow-up in 1-2 weeks  DIET:   Cardiac diet  ACTIVITY:   Activity as tolerated  OXYGEN:   Home Oxygen: No.  Oxygen Delivery: room air  DISCHARGE LOCATION:   home   If you experience worsening of your admission symptoms, develop shortness of breath, life threatening emergency, suicidal or homicidal thoughts you must seek  medical attention immediately by calling 911 or calling your MD immediately  if symptoms less severe.  You Must read complete instructions/literature along with all the possible adverse reactions/side effects for all the Medicines you take and that have been prescribed to you. Take any new Medicines after you have completely understood and accpet all the possible adverse reactions/side effects.   Please note  You were cared for by a hospitalist during your hospital stay. If you have any questions about your discharge medications or the care you received while you were in the hospital after you are discharged, you can call the unit and asked to speak with the  hospitalist on call if the hospitalist that took care of you is not available. Once you are discharged, your primary care physician will handle any further medical issues. Please note that NO REFILLS for any discharge medications will be authorized once you are discharged, as it is imperative that you return to your primary care physician (or establish a relationship with a primary care physician if you do not have one) for your aftercare needs so that they can reassess your need for medications and monitor your lab values.    On the day of Discharge:  VITAL SIGNS:   Blood pressure (!) 157/90, pulse 73, temperature 98.1 F (36.7 C), temperature source Oral, resp. rate 18, height 5\' 6"  (1.676 m), weight 58.1 kg (128 lb), SpO2 100 %.  PHYSICAL EXAMINATION:    GENERAL:  66 y.o.-year-old patient lying in the bed with no acute distress.  EYES: Pupils equal, round, reactive to light and accommodation. No scleral icterus. Extraocular muscles intact.  HEENT: Head atraumatic, normocephalic. Oropharynx and nasopharynx clear.  NECK:  Supple, no jugular venous distention. No thyroid enlargement, no tenderness.  LUNGS: Scant breath sounds bilaterally, no wheezing, rales,rhonchi or crepitation. No use of accessory muscles of respiration.  CARDIOVASCULAR: S1, S2 normal. No murmurs, rubs, or gallops.  ABDOMEN: Soft, non-tender, non-distended. Bowel sounds present. No organomegaly or mass.  EXTREMITIES: No pedal edema, cyanosis, or clubbing.  NEUROLOGIC: Cranial nerves II through XII are intact. Muscle strength 5/5 in all extremities. Sensation intact. Gait not checked.  PSYCHIATRIC: The patient is alert and oriented x 3. Mild cognitive deficit noted SKIN: No obvious rash, lesion, or ulcer.   DATA REVIEW:   CBC  Recent Labs Lab 08/09/17 1211  WBC 4.7  HGB 14.4  HCT 42.5  PLT 116*    Chemistries   Recent Labs Lab 08/09/17 1211 08/10/17 0308  NA 139  --   K 3.4*  --   CL 102  --   CO2 26   --   GLUCOSE 176*  --   BUN 7  --   CREATININE 0.66  --   CALCIUM 9.8  --   MG  --  1.8  AST 250*  --   ALT 100*  --   ALKPHOS 119  --   BILITOT 1.0  --      Microbiology Results  Results for orders placed or performed during the hospital encounter of 08/09/17  Culture, blood (routine x 2) Call MD if unable to obtain prior to antibiotics being given     Status: None (Preliminary result)   Collection Time: 08/09/17  8:17 PM  Result Value Ref Range Status   Specimen Description BLOOD RIGHT ARM  Final   Special Requests   Final    BOTTLES DRAWN AEROBIC AND ANAEROBIC Blood Culture adequate volume   Culture NO GROWTH < 12  HOURS  Final   Report Status PENDING  Incomplete  Culture, blood (routine x 2) Call MD if unable to obtain prior to antibiotics being given     Status: None (Preliminary result)   Collection Time: 08/09/17  8:29 PM  Result Value Ref Range Status   Specimen Description BLOOD LEFT HAND  Final   Special Requests   Final    BOTTLES DRAWN AEROBIC AND ANAEROBIC Blood Culture adequate volume   Culture NO GROWTH < 12 HOURS  Final   Report Status PENDING  Incomplete    RADIOLOGY:  Dg Chest 2 View  Result Date: 08/09/2017 CLINICAL DATA:  Nonproductive cough and shortness breath for 1 week. COPD. EXAM: CHEST  2 VIEW COMPARISON:  06/12/2017 FINDINGS: The heart size and mediastinal contours are within normal limits. Severe emphysema again demonstrated. No evidence of pulmonary infiltrate or edema. No evidence of pleural effusion. IMPRESSION: Severe emphysema.  No active lung disease. Electronically Signed   By: Earle Gell M.D.   On: 08/09/2017 12:56   Ct Angio Chest Pe W And/or Wo Contrast  Result Date: 08/09/2017 CLINICAL DATA:  Shortness of breath. Emphysema. Clinical suspicion for pulmonary embolism. EXAM: CT ANGIOGRAPHY CHEST WITH CONTRAST TECHNIQUE: Multidetector CT imaging of the chest was performed using the standard protocol during bolus administration of intravenous  contrast. Multiplanar CT image reconstructions and MIPs were obtained to evaluate the vascular anatomy. CONTRAST:  60 mL Isovue 370 COMPARISON:  06/12/2017 FINDINGS: Cardiovascular: Satisfactory opacification of pulmonary arteries noted, and no pulmonary emboli identified. No evidence of thoracic aortic dissection or aneurysm. Aortic atherosclerosis. Mediastinum/Nodes: No masses or pathologically enlarged lymph nodes identified. Lungs/Pleura: Moderate emphysema again demonstrated with biapical blebs. New patchy airspace disease is seen within the anterior left upper lobe. New ill-defined nodular density is also seen in the lateral right middle lobe measuring approximately 9 mm. These findings are consistent with pneumonia or other inflammatory etiology. No evidence of pleural effusion. Upper abdomen: Diffuse hepatic steatosis again noted. Musculoskeletal: No suspicious bone lesions identified. Review of the MIP images confirms the above findings. IMPRESSION: No evidence of pulmonary embolism. New patchy airspace disease in anterior left upper lobe and sub-cm ill-defined nodular density in right middle lobe, consistent with infectious or inflammatory etiology. Aortic Atherosclerosis (ICD10-I70.0) and Emphysema (ICD10-J43.9). Hepatic steatosis. Electronically Signed   By: Earle Gell M.D.   On: 08/09/2017 14:07     Management plans discussed with the patient, family and they are in agreement.  CODE STATUS:     Code Status Orders        Start     Ordered   08/09/17 1827  Full code  Continuous     08/09/17 1826    Code Status History    Date Active Date Inactive Code Status Order ID Comments User Context   This patient has a current code status but no historical code status.      TOTAL TIME TAKING CARE OF THIS PATIENT: 37 minutes.    Kaaliyah Kita M.D on 08/10/2017 at 11:01 AM  Between 7am to 6pm - Pager - [phone]  After 6pm go to [email or web address] - [facility]  Office  [phone]  CC: Primary care physician; [facility] [place]   Note: This dictation was prepared with Diplomatic Services operational officer dictation along with smaller phrase technology. Any transcriptional errors that result from this process are unintentional.

## 2017-08-10 NOTE — Clinical Social Work Note (Signed)
Clinical Social Work Assessment  Patient Details  Name: Andre Rios MRN: [id number] Date of Birth: 02-28-1951  Date of referral:  08/10/17               Reason for consult:  Substance Use/ETOH Abuse                Permission sought to share information with:    Permission granted to share information::     Name::        Agency::     Relationship::     Contact Information:     Housing/Transportation Living arrangements for the past 2 months:  Single Family Home Source of Information:  Patient Patient Interpreter Needed:  None Criminal Activity/Legal Involvement Pertinent to Current Situation/Hospitalization:  No - Comment as needed Significant Relationships:  Siblings Lives with:  Siblings Do you feel safe going back to the place where you live?  Yes Need for family participation in patient care:  Yes (Comment)  Care giving concerns:  Patient lives in [place] with his sister and niece.    Social Worker assessment / plan:  Holiday representative (CSW) received ETOH consult from RN in progression rounds this morning. CSW met with patient alone at bedside to address consult. Patient was alert and oriented X4 and was sitting up on the side of the bed. CSW introduced self and explained role of CSW department. Patient reported that he lives in [place] with his sister and niece. Per patient he is independent with his ADLs and still drives. 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 Per patient he drinks beer daily and is interested in outpatient resources. CSW provided patient with a list of outpatient substance abuse treatment options. Patient accepted resources and reported no other needs or concerns at this time. Please reconsult if future social work needs arise. CSW signing off.   Employment status:  Retired Forensic scientist:  [employer] PT Recommendations:  Not assessed at this time Information / Referral to community resources:   Outpatient Substance Abuse Treatment Options  Patient/Family's Response to care:  Patient accepted outpatient substance abuse treatment options.   Patient/Family's Understanding of and Emotional Response to Diagnosis, Current Treatment, and Prognosis:  Patient was very pleasant and thanked CSW for visit.   Emotional Assessment Appearance:  Appears stated age Attitude/Demeanor/Rapport:    Affect (typically observed):  Accepting, Adaptable, Pleasant Orientation:  Oriented to Self, Oriented to Place, Oriented to  Time, Oriented to Situation Alcohol / Substance use:  Alcohol Use Psych involvement (Current and /or in the community):  No (Comment)  Discharge Needs  Concerns to be addressed:  Discharge Planning Concerns Readmission within the last 30 days:  No Current discharge risk:  Substance Abuse Barriers to Discharge:  No Barriers Identified   Mahala Rommel, Veronia Beets, LCSW 08/10/2017, 6:15 PM

## 2017-08-11 LAB — HIV ANTIBODY (ROUTINE TESTING W REFLEX): HIV Screen 4th Generation wRfx: NONREACTIVE

## 2017-08-14 LAB — CULTURE, BLOOD (ROUTINE X 2)
Culture: NO GROWTH
Culture: NO GROWTH
Special Requests: ADEQUATE
Special Requests: ADEQUATE

## 2017-08-26 ENCOUNTER — Other Ambulatory Visit: Payer: Self-pay | Admitting: Family Medicine

## 2017-08-26 DIAGNOSIS — N6311 Unspecified lump in the right breast, upper outer quadrant: Secondary | ICD-10-CM

## 2017-09-01 ENCOUNTER — Inpatient Hospital Stay: Admission: RE | Admit: 2017-09-01 | Payer: BLUE CROSS/BLUE SHIELD | Source: Ambulatory Visit

## 2017-09-01 ENCOUNTER — Other Ambulatory Visit: Payer: BLUE CROSS/BLUE SHIELD

## 2017-12-18 ENCOUNTER — Emergency Department: Payer: Medicare Other

## 2017-12-18 ENCOUNTER — Emergency Department
Admission: EM | Admit: 2017-12-18 | Discharge: 2017-12-19 | Disposition: A | Discharge status: Home / Self Care | Payer: Medicare Other | Attending: Student in an Organized Health Care Education/Training Program | Admitting: Student in an Organized Health Care Education/Training Program

## 2017-12-18 ENCOUNTER — Other Ambulatory Visit: Payer: Self-pay

## 2017-12-18 DIAGNOSIS — J209 Acute bronchitis, unspecified: Secondary | ICD-10-CM | POA: Insufficient documentation

## 2017-12-18 DIAGNOSIS — J40 Bronchitis, not specified as acute or chronic: Secondary | ICD-10-CM

## 2017-12-18 DIAGNOSIS — J449 Chronic obstructive pulmonary disease, unspecified: Secondary | ICD-10-CM | POA: Diagnosis not present

## 2017-12-18 DIAGNOSIS — J45909 Unspecified asthma, uncomplicated: Secondary | ICD-10-CM | POA: Insufficient documentation

## 2017-12-18 DIAGNOSIS — Z79899 Other long term (current) drug therapy: Secondary | ICD-10-CM | POA: Insufficient documentation

## 2017-12-18 DIAGNOSIS — R42 Dizziness and giddiness: Secondary | ICD-10-CM | POA: Diagnosis present

## 2017-12-18 DIAGNOSIS — B349 Viral infection, unspecified: Secondary | ICD-10-CM

## 2017-12-18 LAB — CBC
HCT: 42.9 % (ref 40.0–52.0)
HEMOGLOBIN: 14.6 g/dL (ref 13.0–18.0)
MCH: 30.3 pg (ref 26.0–34.0)
MCHC: 34.1 g/dL (ref 32.0–36.0)
MCV: 88.7 fL (ref 80.0–100.0)
PLATELETS: 166 10*3/uL (ref 150–440)
RBC: 4.84 MIL/uL (ref 4.40–5.90)
RDW: 13 % (ref 11.5–14.5)
WBC: 4.2 10*3/uL (ref 3.8–10.6)

## 2017-12-18 LAB — BASIC METABOLIC PANEL
ANION GAP: 12 (ref 5–15)
BUN: 13 mg/dL (ref 6–20)
CALCIUM: 9.6 mg/dL (ref 8.9–10.3)
CO2: 24 mmol/L (ref 22–32)
CREATININE: 0.86 mg/dL (ref 0.61–1.24)
Chloride: 96 mmol/L — ABNORMAL LOW (ref 101–111)
GFR calc Af Amer: >60 mL/min (ref >60)
GLUCOSE: 176 mg/dL — AB (ref 65–99)
Potassium: 4.2 mmol/L (ref 3.5–5.1)
Sodium: 132 mmol/L — ABNORMAL LOW (ref 135–145)

## 2017-12-18 LAB — INFLUENZA PANEL BY PCR (TYPE A & B)
INFLAPCR: NEGATIVE
INFLBPCR: NEGATIVE

## 2017-12-18 MED ORDER — DOXYCYCLINE HYCLATE 100 MG PO TABS: 100.0000 mg | ORAL_TABLET | Freq: Two times a day (BID) | ORAL | 0 refills | Status: AC

## 2017-12-18 MED ORDER — ALBUTEROL SULFATE HFA 108 (90 BASE) MCG/ACT IN AERS: 2.0000 | INHALATION_SPRAY | RESPIRATORY_TRACT | 0 refills | Status: DC | PRN

## 2017-12-18 MED ORDER — IPRATROPIUM-ALBUTEROL 0.5-2.5 (3) MG/3ML IN SOLN
3.0000 mL | Freq: Once | RESPIRATORY_TRACT | Status: AC
  Administered 2017-12-18: 3 mL via RESPIRATORY_TRACT
  Filled 2017-12-18: qty 3

## 2017-12-18 MED ORDER — PREDNISONE 20 MG PO TABS: 40.0000 mg | ORAL_TABLET | Freq: Every day | ORAL | 0 refills | Status: AC

## 2017-12-18 NOTE — ED Notes (Signed)
Patient reports increased comfort of breathing post duoneb treatment

## 2017-12-18 NOTE — ED Provider Notes (Signed)
Andre Rios    First MD Initiated Contact with Patient 12/18/17 2146     (approximate)  I have reviewed the triage vital signs and the nursing notes.   HISTORY  Chief Complaint Fever and Dizziness    HPI Andre Rios is a 67 y.o. male history of COPD admission the last year for pneumonia with no recent antibiotic use steroids presents with roughly 2-3 weeks of intermittent congestion associated with lightheadedness and 1 day of productive cough with subjective fever at home.  States he felt his head was sweating want to be evaluated due to concern for pneumonia.  Did not get his flu shot.  Has had some mild nausea but no pain.  Does still smoke cigarettes.  Past Medical History:  Diagnosis Date  . Alcohol abuse   . Asthma   . COPD (chronic obstructive pulmonary disease) ([place])   . Pneumonia   . Seasonal allergies    No family history on file. Past Surgical History:  Procedure Laterality Date  . EYE SURGERY     Patient Active Problem List   Diagnosis Date Noted  . PNA (pneumonia) 08/09/2017  . 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 Alcohol abuse 08/08/2016  . Involuntary commitment 08/08/2016  . 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 Authorizing Provider  acetaminophen (TYLENOL) 500 MG tablet Take 500 mg by mouth every 6 (six) hours as needed.    [provider]  albuterol (PROVENTIL HFA;VENTOLIN HFA) 108 (90 Base) MCG/ACT inhaler Inhale 2 puffs into the lungs every 4 (four) hours as needed for wheezing or shortness of breath. 12/18/17   Merlyn Lot, MD  azithromycin (ZITHROMAX) 500 MG tablet Take 1 tablet (500 mg total) by mouth daily. X 4 more days 08/11/17   Gladstone Lighter, MD  cefUROXime (CEFTIN) 500 MG tablet Take 1 tablet (500 mg total) by mouth 2 (two) times daily with a meal. X 7 days 08/10/17   Gladstone Lighter,  MD  doxycycline (VIBRA-TABS) 100 MG tablet Take 1 tablet (100 mg total) by mouth 2 (two) times daily for 7 days. 12/18/17 12/25/17  Merlyn Lot, MD  ibuprofen (ADVIL,MOTRIN) 200 MG tablet Take 200 mg by mouth every 6 (six) hours as needed.    [provider]  Phenyleph-Doxylamine-DM-APAP (ALKA SELTZER PLUS PO) Take 1 tablet by mouth daily as needed.    [provider]  predniSONE (DELTASONE) 20 MG tablet Take 2 tablets (40 mg total) by mouth daily for 4 days. 12/18/17 12/22/17  Merlyn Lot, MD    Allergies Patient has no known allergies.    Social History Social History   Tobacco Use  . Smoking status: Current Every Day Smoker  . Smokeless tobacco: Never Used  Substance Use Topics  . Alcohol use: Yes    Alcohol/week: 3.0 oz    Types: 5 Cans of beer per week    Comment: several times a week  . Drug use: No    Review of Systems Patient denies headaches, rhinorrhea, blurry vision, numbness, shortness of breath, chest pain, edema, cough, abdominal pain, nausea, vomiting, diarrhea, dysuria, fevers, rashes or hallucinations unless otherwise stated above in HPI. ____________________________________________   PHYSICAL EXAM:  VITAL SIGNS: Vitals:   12/18/17 1959 12/18/17 2159  BP: (!) 155/89 (!) 131/102  Pulse: 97 89  Resp: 20 18  Temp: 98.1 F (36.7 C) 98 F (36.7 C)  SpO2:  97% 99%    Constitutional: Alert and oriented. Well appearing and in no acute distress. Eyes: Conjunctivae are normal.  Head: Atraumatic. Nose: No congestion/rhinnorhea. Mouth/Throat: Mucous membranes are moist.   Neck: No stridor. Painless ROM.  Cardiovascular: Normal rate, regular rhythm. Grossly normal heart sounds.  Good peripheral circulation. Respiratory: Normal respiratory effort.  No retractions. Lungs with bibasilar wheeze Gastrointestinal: Soft and nontender. No distention. No abdominal bruits. No CVA tenderness. Genitourinary:  Musculoskeletal: No lower extremity  tenderness nor edema.  No joint effusions. Neurologic:  Normal speech and language. No gross focal neurologic deficits are appreciated. No facial droop Skin:  Skin is warm, dry and intact. No rash noted. Psychiatric: Mood and affect are normal. Speech and behavior are normal.  ____________________________________________   LABS (all labs ordered are listed, but only abnormal results are displayed)  Results for orders placed or performed during the hospital encounter of 12/18/17 (from the past 24 hour(s))  Basic metabolic panel     Status: Abnormal   Collection Time: 12/18/17  7:59 PM  Result Value Ref Range   Sodium 132 (L) 135 - 145 mmol/L   Potassium 4.2 3.5 - 5.1 mmol/L   Chloride 96 (L) 101 - 111 mmol/L   CO2 24 22 - 32 mmol/L   Glucose, Bld 176 (H) 65 - 99 mg/dL   BUN 13 6 - 20 mg/dL   Creatinine, Ser 0.86 0.61 - 1.24 mg/dL   Calcium 9.6 8.9 - 10.3 mg/dL   GFR calc non Af Amer >60 >60 mL/min   GFR calc Af Amer >60 >60 mL/min   Anion gap 12 5 - 15  CBC     Status: None   Collection Time: 12/18/17  7:59 PM  Result Value Ref Range   WBC 4.2 3.8 - 10.6 K/uL   RBC 4.84 4.40 - 5.90 MIL/uL   Hemoglobin 14.6 13.0 - 18.0 g/dL   HCT 42.9 40.0 - 52.0 %   MCV 88.7 80.0 - 100.0 fL   MCH 30.3 26.0 - 34.0 pg   MCHC 34.1 32.0 - 36.0 g/dL   RDW 13.0 11.5 - 14.5 %   Platelets 166 150 - 440 K/uL  Influenza panel by PCR (type A & B)     Status: None   Collection Time: 12/18/17 10:12 PM  Result Value Ref Range   Influenza A By PCR NEGATIVE NEGATIVE   Influenza B By PCR NEGATIVE NEGATIVE   ____________________________________________  EKG My review and personal interpretation at Time: 20:02   Indication: cough  Rate: 90  Rhythm: sinus Axis: normal Other: normal intervals, no stemi ____________________________________________  RADIOLOGY  I personally reviewed all radiographic images ordered to evaluate for the above acute complaints and reviewed radiology reports and findings.   These findings were personally discussed with the patient.  Please see medical record for radiology report.  ____________________________________________   PROCEDURES  Procedure(s) performed:  Procedures    Critical Care performed: no ____________________________________________   INITIAL IMPRESSION / ASSESSMENT AND PLAN / ED COURSE  Pertinent labs & imaging results that were available during my care of the patient were reviewed by me and considered in my medical decision making (see chart for details).  DDX: pna, uri, flu, sepsis, bacteremia  Andre Rios is a 67 y.o. who presents to the ED with flulike illness as described above.  No evidence of pneumonia.  No respiratory distress.  His abdominal exam is soft benign.  Flu is negative.  He has no white count or  signs of Sirs or sepsis.  Do suspect some component of bronchitis given his underlying COPD with productive cough will give course of doxycycline with prednisone taper and albuterol.  Have discussed with the patient and available family all diagnostics and treatments performed thus far and all questions were answered to the best of my ability. The patient demonstrates understanding and agreement with plan.       ____________________________________________   FINAL CLINICAL IMPRESSION(S) / ED DIAGNOSES  Final diagnoses:  Viral illness  Bronchitis      NEW MEDICATIONS STARTED DURING THIS VISIT:  This SmartLink is deprecated. Use AVSMEDLIST instead to display the medication list for a patient.   Rios:  This document was prepared using Dragon voice recognition software and may include unintentional dictation errors.    Merlyn Lot, MD 12/18/17 [phone]

## 2017-12-18 NOTE — ED Triage Notes (Addendum)
Patient reports fever all day, cough, congestion, aching and lightheaded.

## 2017-12-18 NOTE — ED Notes (Signed)
Patient transported to X-ray 

## 2017-12-18 NOTE — ED Notes (Signed)
Patient c/o fever at home, (unmeasured), dizziness at home, productive cough and sinus congestion for several weeks.

## 2017-12-18 NOTE — ED Notes (Signed)
ED Provider at bedside. 

## 2017-12-19 MED ORDER — DOXYCYCLINE HYCLATE 100 MG PO TABS
ORAL_TABLET | ORAL | Status: AC
  Administered 2017-12-19: 100 mg via ORAL
  Filled 2017-12-19: qty 1

## 2017-12-19 MED ORDER — DOXYCYCLINE HYCLATE 100 MG PO TABS
100.0000 mg | ORAL_TABLET | Freq: Once | ORAL | Status: AC
  Administered 2017-12-19: 100 mg via ORAL

## 2017-12-19 NOTE — ED Notes (Signed)
Reviewed discharge instructions, follow-up care, and prescriptions with patient. Patient verbalized understanding of all information reviewed. Patient stable, with no distress noted at this time.    

## 2018-08-11 ENCOUNTER — Encounter: Payer: Self-pay | Admitting: *Deleted

## 2018-08-11 ENCOUNTER — Other Ambulatory Visit: Payer: Self-pay

## 2018-08-11 ENCOUNTER — Observation Stay
Admission: EM | Admit: 2018-08-11 | Discharge: 2018-08-12 | Disposition: A | Discharge status: Home / Self Care | Payer: Medicare HMO | Attending: Internal Medicine | Admitting: Internal Medicine

## 2018-08-11 DIAGNOSIS — F1010 Alcohol abuse, uncomplicated: Secondary | ICD-10-CM | POA: Diagnosis not present

## 2018-08-11 DIAGNOSIS — R05 Cough: Secondary | ICD-10-CM

## 2018-08-11 DIAGNOSIS — E860 Dehydration: Secondary | ICD-10-CM | POA: Diagnosis not present

## 2018-08-11 DIAGNOSIS — R112 Nausea with vomiting, unspecified: Secondary | ICD-10-CM | POA: Diagnosis present

## 2018-08-11 DIAGNOSIS — R42 Dizziness and giddiness: Secondary | ICD-10-CM | POA: Diagnosis present

## 2018-08-11 DIAGNOSIS — E872 Acidosis: Secondary | ICD-10-CM | POA: Diagnosis present

## 2018-08-11 DIAGNOSIS — F17200 Nicotine dependence, unspecified, uncomplicated: Secondary | ICD-10-CM | POA: Diagnosis not present

## 2018-08-11 DIAGNOSIS — K860 Alcohol-induced chronic pancreatitis: Secondary | ICD-10-CM | POA: Insufficient documentation

## 2018-08-11 DIAGNOSIS — E8729 Other acidosis: Secondary | ICD-10-CM

## 2018-08-11 DIAGNOSIS — R059 Cough, unspecified: Secondary | ICD-10-CM

## 2018-08-11 DIAGNOSIS — R739 Hyperglycemia, unspecified: Secondary | ICD-10-CM

## 2018-08-11 DIAGNOSIS — Z79899 Other long term (current) drug therapy: Secondary | ICD-10-CM | POA: Diagnosis not present

## 2018-08-11 DIAGNOSIS — I951 Orthostatic hypotension: Secondary | ICD-10-CM

## 2018-08-11 DIAGNOSIS — J449 Chronic obstructive pulmonary disease, unspecified: Secondary | ICD-10-CM | POA: Diagnosis not present

## 2018-08-11 LAB — BASIC METABOLIC PANEL
ANION GAP: 27 — AB (ref 5–15)
ANION GAP: 18 — AB (ref 5–15)
BUN: 13 mg/dL (ref 8–23)
BUN: 12 mg/dL (ref 8–23)
CHLORIDE: 95 mmol/L — AB (ref 98–111)
CHLORIDE: 99 mmol/L (ref 98–111)
CO2: 17 mmol/L — AB (ref 22–32)
CO2: 20 mmol/L — AB (ref 22–32)
Calcium: 9.8 mg/dL (ref 8.9–10.3)
Calcium: 9 mg/dL (ref 8.9–10.3)
Creatinine, Ser: 1.06 mg/dL (ref 0.61–1.24)
Creatinine, Ser: 0.96 mg/dL (ref 0.61–1.24)
GFR calc Af Amer: >60 mL/min (ref >60)
GFR calc non Af Amer: >60 mL/min (ref >60)
GLUCOSE: 160 mg/dL — AB (ref 70–99)
GLUCOSE: 151 mg/dL — AB (ref 70–99)
POTASSIUM: 4.8 mmol/L (ref 3.5–5.1)
POTASSIUM: 5.1 mmol/L (ref 3.5–5.1)
Sodium: 139 mmol/L (ref 135–145)
Sodium: 137 mmol/L (ref 135–145)

## 2018-08-11 LAB — CBC
HEMATOCRIT: 47.6 % (ref 40.0–52.0)
HEMOGLOBIN: 16.5 g/dL (ref 13.0–18.0)
MCH: 31 pg (ref 26.0–34.0)
MCHC: 34.6 g/dL (ref 32.0–36.0)
MCV: 89.6 fL (ref 80.0–100.0)
Platelets: 190 10*3/uL (ref 150–440)
RBC: 5.31 MIL/uL (ref 4.40–5.90)
RDW: 14.5 % (ref 11.5–14.5)
WBC: 7.2 10*3/uL (ref 3.8–10.6)

## 2018-08-11 LAB — GLUCOSE, CAPILLARY: Glucose-Capillary: 142 mg/dL — ABNORMAL HIGH (ref 70–99)

## 2018-08-11 LAB — URINALYSIS, COMPLETE (UACMP) WITH MICROSCOPIC
BACTERIA UA: NONE SEEN
Bilirubin Urine: NEGATIVE
GLUCOSE, UA: 50 mg/dL — AB
KETONES UR: 80 mg/dL — AB
Leukocytes, UA: NEGATIVE
NITRITE: NEGATIVE
PROTEIN: 100 mg/dL — AB
Specific Gravity, Urine: 1.019 (ref 1.005–1.030)
pH: 5 (ref 5.0–8.0)

## 2018-08-11 LAB — HEPATIC FUNCTION PANEL
ALK PHOS: 69 U/L (ref 38–126)
ALT: 35 U/L (ref 0–44)
AST: 87 U/L — ABNORMAL HIGH (ref 15–41)
Albumin: 3.8 g/dL (ref 3.5–5.0)
BILIRUBIN INDIRECT: 1.5 mg/dL — AB (ref 0.3–0.9)
BILIRUBIN TOTAL: 1.8 mg/dL — AB (ref 0.3–1.2)
Bilirubin, Direct: 0.3 mg/dL — ABNORMAL HIGH (ref 0.0–0.2)
TOTAL PROTEIN: 7.3 g/dL (ref 6.5–8.1)

## 2018-08-11 LAB — MAGNESIUM: Magnesium: 2.2 mg/dL (ref 1.7–2.4)

## 2018-08-11 LAB — TROPONIN I

## 2018-08-11 LAB — LIPASE, BLOOD: LIPASE: 70 U/L — AB (ref 11–51)

## 2018-08-11 MED ORDER — SODIUM CHLORIDE 0.9 % IV BOLUS
1500.0000 mL | Freq: Once | INTRAVENOUS | Status: AC
  Administered 2018-08-11: 1500 mL via INTRAVENOUS

## 2018-08-11 MED ORDER — SODIUM CHLORIDE 0.9 % IV BOLUS
1000.0000 mL | Freq: Once | INTRAVENOUS | Status: AC
  Administered 2018-08-11: 1000 mL via INTRAVENOUS

## 2018-08-11 MED ORDER — THIAMINE HCL 100 MG/ML IJ SOLN
100.0000 mg | Freq: Once | INTRAMUSCULAR | Status: AC
  Administered 2018-08-11: 100 mg via INTRAVENOUS
  Filled 2018-08-11: qty 2

## 2018-08-11 MED ORDER — MORPHINE SULFATE (PF) 2 MG/ML IV SOLN: 2.0000 mg | INTRAVENOUS | Status: DC | PRN

## 2018-08-11 MED ORDER — LORAZEPAM 2 MG/ML IJ SOLN
1.0000 mg | Freq: Once | INTRAMUSCULAR | Status: AC
  Administered 2018-08-11: 1 mg via INTRAVENOUS
  Filled 2018-08-11: qty 1

## 2018-08-11 NOTE — ED Triage Notes (Signed)
PT to ED reporting dizziness and vomiting throughout the day today,. Three episodes of vomiting with decreased appetite. Pt also reports feeling as though there is a ringing in his ears. Pt reports he drinks everyday and is shaking in triage but reports this is normal for him. No decrease in alcohol consumption reported.

## 2018-08-11 NOTE — ED Provider Notes (Signed)
[facility] Emergency Department Provider Note  ____________________________________________  Time seen: Approximately 9:37 PM  I have reviewed the triage vital signs and the nursing notes.   HISTORY  Chief Complaint Dizziness    HPI Andre Rios is a 67 y.o. male history of alcohol dependence, COPD, presenting for nausea and vomiting, epigastric discomfort.  The patient reports that he generally drinks 2-3 beers daily.  He states that yesterday he was drinking but did not have anything to eat so he began to feel nauseated and had several episodes of vomiting.  Today he has not tried to eat or drink anything and has vomited last.  He describes a cramping sensation in the epigastrium.  No fevers or chills.  Last bowel movement was yesterday and it was normal.  No dysuria.  Patient states that he does want to detox, but plans to do this with an outside provider through his insurance.  He does not want any information about detox in the emergency department.  Past Medical History:  Diagnosis Date  . Alcohol abuse   . Asthma   . COPD (chronic obstructive pulmonary disease) ([place])   . Pneumonia   . Seasonal allergies     Patient Active Problem List   Diagnosis Date Noted  . PNA (pneumonia) 08/09/2017  . 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 Alcohol abuse 08/08/2016  . Involuntary commitment 08/08/2016  . 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 EYE SURGERY      Current Outpatient Rx  . Order #: [id number] Class: Historical Med  . Order #: [id number] Class: Print  . Order #: [id number] Class: Normal  . Order #: [id number] Class: Normal  . Order #: [id number] Class: Historical Med  . Order #: [id number] Class: Historical Med    Allergies Patient has no known allergies.  History reviewed. No pertinent family history.  Social History Social History   Tobacco Use  . Smoking status: Current Every Day Smoker  .  Smokeless tobacco: Never Used  Substance Use Topics  . Alcohol use: Yes    Alcohol/week: 5.0 standard drinks    Types: 5 Cans of beer per week    Comment: several times a week  . Drug use: No    Review of Systems Constitutional: No fever/chills.  + Lightheadedness without syncope.   Eyes: No visual changes. ENT: No sore throat. No congestion or rhinorrhea. Cardiovascular: Denies chest pain. Denies palpitations. Respiratory: Denies shortness of breath.  No cough. Gastrointestinal: + abdominal pain.  + nausea, + vomiting.  No diarrhea.  No constipation. Genitourinary: Negative for dysuria.  Urinary frequency. Musculoskeletal: Negative for back pain. Skin: Negative for rash. Neurological: Negative for headaches. No focal numbness, tingling or weakness.  Psych: Alcohol abuse.   ____________________________________________   PHYSICAL EXAM:  VITAL SIGNS: ED Triage Vitals  Enc Vitals Group     BP 08/11/18 1910 (!) 137/94     Pulse Rate 08/11/18 1910 (!) 111     Resp 08/11/18 1910 16     Temp 08/11/18 1910 98 F (36.7 C)     Temp Source 08/11/18 1910 Oral     SpO2 08/11/18 1910 98 %     Weight 08/11/18 1911 132 lb 0.9 oz (59.9 kg)     Height --      Head Circumference --      Peak Flow --      Pain Score 08/11/18 1911 0     Pain Loc --  Pain Edu? --      Excl. in [place]? --     Constitutional: Alert and oriented. Answers questions appropriately. Eyes: Conjunctivae are injected bilaterally..  EOMI. No scleral icterus.  Positive arcus senilis. Head: Atraumatic. Nose: No congestion/rhinnorhea. Mouth/Throat: Mucous membranes are dry.  Neck: No stridor.  Supple.  No JVD.  No meningismus. Cardiovascular: Normal rate, regular rhythm. No murmurs, rubs or gallops.  Respiratory: Normal respiratory effort.  No accessory muscle use or retractions. Lungs CTAB.  No wheezes, rales or ronchi. Gastrointestinal: Soft, and nondistended.  Minimal tenderness to palpation in epigastrium.   Negative Murphy sign.  No guarding or rebound.  No peritoneal signs. Musculoskeletal: No LE edema. Neurologic:  A&Ox3.  Speech is clear.  Face and smile are symmetric.  EOMI.  Moves all extremities well. Skin:  Skin is warm, dry and intact. No rash noted. Psychiatric: Mood and affect are normal. ___________________________________   LABS (all labs ordered are listed, but only abnormal results are displayed)  Labs Reviewed  BASIC METABOLIC PANEL - Abnormal; Notable for the following components:      Result Value   Chloride 95 (*)    CO2 17 (*)    Glucose, Bld 160 (*)    Anion gap 27 (*)    All other components within normal limits  URINALYSIS, COMPLETE (UACMP) WITH MICROSCOPIC - Abnormal; Notable for the following components:   Color, Urine YELLOW (*)    APPearance CLEAR (*)    Glucose, UA 50 (*)    Hgb urine dipstick SMALL (*)    Ketones, ur 80 (*)    Protein, ur 100 (*)    All other components within normal limits  GLUCOSE, CAPILLARY - Abnormal; Notable for the following components:   Glucose-Capillary 142 (*)    All other components within normal limits  HEPATIC FUNCTION PANEL - Abnormal; Notable for the following components:   AST 87 (*)    Total Bilirubin 1.8 (*)    Bilirubin, Direct 0.3 (*)    Indirect Bilirubin 1.5 (*)    All other components within normal limits  LIPASE, BLOOD - Abnormal; Notable for the following components:   Lipase 70 (*)    All other components within normal limits  BASIC METABOLIC PANEL - Abnormal; Notable for the following components:   CO2 20 (*)    Glucose, Bld 151 (*)    Anion gap 18 (*)    All other components within normal limits  CBC  TROPONIN I  CBG MONITORING, ED   ____________________________________________  EKG  ED ECG REPORT I, Anne-Caroline Mariea Clonts, the attending physician, personally viewed and interpreted this ECG.   Date: 08/11/2018  EKG Time: 1912  Rate: 106  Rhythm: sinus tachycardia  Axis: normal   Intervals:none  ST&T Change: No STEMI  ____________________________________________  RADIOLOGY  No results found.  ____________________________________________   PROCEDURES  Procedure(s) performed: None  Procedures  Critical Care performed: No ____________________________________________   INITIAL IMPRESSION / ASSESSMENT AND PLAN / ED COURSE  Pertinent labs & imaging results that were available during my care of the patient were reviewed by me and considered in my medical decision making (see chart for details).  67 y.o. male with 2 days of nausea and vomiting, mild epigastric discomfort, and alcohol abuse.  Overall, the patient is mildly hypertensive at 157/93 with a heart rate in the 80s.  I do not see any external evidence of severe withdrawal symptoms but given his nausea and vomiting, and mild hypertension, and that he  has not had any alcohol since yesterday, I will treat him with Ativan.  Patient is orthostatic on examination and will receive intravenous fluids.  Today, he is hyperglycemic to 160 and has an anion gap of 27.  He does not have a history of diabetes, and I will give him intravenous fluids and recheck that his numbers are trending in the right direction and that he continues to be clinically stable.  A hepatic function panel as well as urinalysis are also pending.  ----------------------------------------- 11:10 PM on 08/11/2018 -----------------------------------------  The patient has a blood sugar of 160 with an anion gap of 27 and even after fluids, his anion gap continues to be 18.  His urine does show significant ketosis.  His findings are most consistent with alcoholic ketoacidosis now plan to admit him to the hospital for continued monitoring and treatment.  Today, his bilirubin is 1.8 with an AST of 87; my suspicion for an acute gallbladder disease is very low and he will require repeat abdominal examinations.  His lipase today is 70.  Plan  admission.  ____________________________________________  FINAL CLINICAL IMPRESSION(S) / ED DIAGNOSES  Final diagnoses:  Orthostasis  Alcoholic ketoacidosis  Hyperglycemia         NEW MEDICATIONS STARTED DURING THIS VISIT:  New Prescriptions   No medications on file      Eula Listen, MD 08/11/18 2311

## 2018-08-12 ENCOUNTER — Observation Stay: Payer: Medicare HMO

## 2018-08-12 ENCOUNTER — Other Ambulatory Visit: Payer: Self-pay

## 2018-08-12 LAB — BASIC METABOLIC PANEL
Anion gap: 9 (ref 5–15)
BUN: 11 mg/dL (ref 8–23)
CALCIUM: 8.1 mg/dL — AB (ref 8.9–10.3)
CHLORIDE: 106 mmol/L (ref 98–111)
CO2: 23 mmol/L (ref 22–32)
CREATININE: 0.64 mg/dL (ref 0.61–1.24)
GFR calc non Af Amer: >60 mL/min (ref >60)
GLUCOSE: 227 mg/dL — AB (ref 70–99)
Potassium: 4.2 mmol/L (ref 3.5–5.1)
Sodium: 138 mmol/L (ref 135–145)

## 2018-08-12 LAB — GLUCOSE, CAPILLARY
Glucose-Capillary: 158 mg/dL — ABNORMAL HIGH (ref 70–99)
Glucose-Capillary: 143 mg/dL — ABNORMAL HIGH (ref 70–99)

## 2018-08-12 MED ORDER — LORAZEPAM 2 MG/ML IJ SOLN: 0.0000 mg | Freq: Two times a day (BID) | INTRAMUSCULAR | Status: DC

## 2018-08-12 MED ORDER — BENZONATATE 100 MG PO CAPS: 200.0000 mg | ORAL_CAPSULE | Freq: Three times a day (TID) | ORAL | Status: DC | PRN

## 2018-08-12 MED ORDER — BISACODYL 5 MG PO TBEC: 5.0000 mg | DELAYED_RELEASE_TABLET | Freq: Every day | ORAL | Status: DC | PRN

## 2018-08-12 MED ORDER — LORAZEPAM 2 MG/ML IJ SOLN: 1.0000 mg | Freq: Four times a day (QID) | INTRAMUSCULAR | Status: DC | PRN

## 2018-08-12 MED ORDER — VITAMIN B-1 100 MG PO TABS
100.0000 mg | ORAL_TABLET | Freq: Every day | ORAL | Status: DC
  Administered 2018-08-12: 13:00:00 100 mg via ORAL
  Filled 2018-08-12: qty 1

## 2018-08-12 MED ORDER — THIAMINE HCL 100 MG/ML IJ SOLN
100.0000 mg | Freq: Every day | INTRAMUSCULAR | Status: DC
  Filled 2018-08-12: qty 1

## 2018-08-12 MED ORDER — ADULT MULTIVITAMIN W/MINERALS CH
1.0000 | ORAL_TABLET | Freq: Every day | ORAL | Status: DC
  Administered 2018-08-12: 1 via ORAL
  Filled 2018-08-12: qty 1

## 2018-08-12 MED ORDER — ONDANSETRON HCL 4 MG PO TABS: 4.0000 mg | ORAL_TABLET | Freq: Four times a day (QID) | ORAL | Status: DC | PRN

## 2018-08-12 MED ORDER — LORAZEPAM 2 MG/ML IJ SOLN: 0.0000 mg | Freq: Four times a day (QID) | INTRAMUSCULAR | Status: DC

## 2018-08-12 MED ORDER — SENNOSIDES-DOCUSATE SODIUM 8.6-50 MG PO TABS: 1.0000 | ORAL_TABLET | Freq: Every evening | ORAL | Status: DC | PRN

## 2018-08-12 MED ORDER — FOLIC ACID 1 MG PO TABS
1.0000 mg | ORAL_TABLET | Freq: Every day | ORAL | Status: DC
  Administered 2018-08-12: 1 mg via ORAL
  Filled 2018-08-12: qty 1

## 2018-08-12 MED ORDER — GUAIFENESIN ER 600 MG PO TB12: 1200.0000 mg | ORAL_TABLET | Freq: Two times a day (BID) | ORAL | Status: DC | PRN

## 2018-08-12 MED ORDER — ALBUTEROL SULFATE (2.5 MG/3ML) 0.083% IN NEBU: 2.5000 mg | INHALATION_SOLUTION | Freq: Four times a day (QID) | RESPIRATORY_TRACT | Status: DC | PRN

## 2018-08-12 MED ORDER — SODIUM CHLORIDE 0.9 % IV SOLN
INTRAVENOUS | Status: AC
  Administered 2018-08-12: 02:00:00 via INTRAVENOUS

## 2018-08-12 MED ORDER — LORAZEPAM 1 MG PO TABS: 1.0000 mg | ORAL_TABLET | Freq: Four times a day (QID) | ORAL | Status: DC | PRN

## 2018-08-12 MED ORDER — FLUTICASONE PROPIONATE 50 MCG/ACT NA SUSP: 1.0000 | Freq: Every day | NASAL | 0 refills | Status: DC

## 2018-08-12 MED ORDER — ENOXAPARIN SODIUM 40 MG/0.4ML ~~LOC~~ SOLN: 40.0000 mg | SUBCUTANEOUS | Status: DC

## 2018-08-12 MED ORDER — ONDANSETRON HCL 4 MG/2ML IJ SOLN: 4.0000 mg | Freq: Four times a day (QID) | INTRAMUSCULAR | Status: DC | PRN

## 2018-08-12 NOTE — ED Notes (Signed)
Report was called and given to [facility]

## 2018-08-12 NOTE — ED Notes (Signed)
Called to give report, nurse was in another room. Stated she would call back at [phone]

## 2018-08-12 NOTE — ED Notes (Signed)
Back is back from medical image.

## 2018-08-12 NOTE — Discharge Instructions (Signed)
QUIT ALCOHOL °

## 2018-08-12 NOTE — H&P (Signed)
[place] at [place] NAME: Andre Rios    MR#:  [id number]  DATE OF BIRTH:  07-Apr-1951  DATE OF ADMISSION:  08/11/2018  PRIMARY CARE PHYSICIAN: [facility]   REQUESTING/REFERRING PHYSICIAN: Eula Listen, MD  CHIEF COMPLAINT:   Chief Complaint  Patient presents with  . Dizziness    HISTORY OF PRESENT ILLNESS:  Andre Rios  is a 67 y.o. male with a known history of COPD, EtOH abuse p/w 2d Hx lightheadedness, N/V/AP. He is a poor historian, giving a vague meandering history w/ unclear timeframe of symptom onset. He also falls asleep multiple times during Hx/ROS. From what I gather, pt drinks ~4x 40oz beer/day. He is retired, and states he started drinking in the late morning or around midday. He states he felt fine on Monday 08/26, but states he began to experience positional lightheadedness on Tuesday (08/27) morning. He first noticed it when he got up to go to the bathroom. He states later in the morning he developed N/V. He denies hematemesis. He also endorses some mild epigastric discomfort. He denies diarrhea or blood in the stool. He endorses decreased PO intake and dehydration. His sister brought him to the hospital. He states his last drink was sometime around midday on Tuesday 08/27. Endorses unrelated persistent cough, denies SOB/wheezing. States he recently completed a course of outpt ABx. I am unable to obtain any other information. Pt appears comfortable, does not appear septic/toxic, and is in no acute distress.  PAST MEDICAL HISTORY:   Past Medical History:  Diagnosis Date  . Alcohol abuse   . Asthma   . COPD (chronic obstructive pulmonary disease) ([place])   . Pneumonia   . Seasonal allergies     PAST SURGICAL HISTORY:   Past Surgical History:  Procedure Laterality Date  . EYE SURGERY      SOCIAL HISTORY:   Social History   Tobacco Use  . Smoking status: Current Every Day Smoker    . Smokeless tobacco: Never Used  Substance Use Topics  . Alcohol use: Yes    Alcohol/week: 5.0 standard drinks    Types: 5 Cans of beer per week    Comment: several times a week    FAMILY HISTORY:  History reviewed. No pertinent family history.  DRUG ALLERGIES:  No Known Allergies  REVIEW OF SYSTEMS:   Review of Systems  Constitutional: Negative for chills, diaphoresis, fever, malaise/fatigue and weight loss.  HENT: Negative for congestion, ear pain, hearing loss, nosebleeds, sinus pain, sore throat and tinnitus.   Eyes: Negative for blurred vision, double vision and photophobia.  Respiratory: Positive for cough. Negative for hemoptysis, sputum production, shortness of breath and wheezing.   Cardiovascular: Negative for chest pain, palpitations, orthopnea, claudication, leg swelling and PND.  Gastrointestinal: Positive for abdominal pain, nausea and vomiting. Negative for blood in stool, constipation, diarrhea, heartburn and melena.  Genitourinary: Negative for dysuria, frequency, hematuria and urgency.  Musculoskeletal: Negative for back pain, joint pain, myalgias and neck pain.  Skin: Negative for itching and rash.  Neurological: Positive for dizziness. Negative for tingling, tremors, sensory change, speech change, focal weakness, seizures, loss of consciousness, weakness and headaches.  Psychiatric/Behavioral: Negative for memory loss. The patient does not have insomnia.    MEDICATIONS AT HOME:   Prior to Admission medications   Medication Sig Start Date End Date Taking? Authorizing Provider  acetaminophen (TYLENOL) 500 MG tablet Take 500 mg by mouth every 6 (six) hours as  needed for mild pain, fever or headache.    Yes [provider]  albuterol (PROVENTIL HFA;VENTOLIN HFA) 108 (90 Base) MCG/ACT inhaler Inhale 2 puffs into the lungs every 4 (four) hours as needed for wheezing or shortness of breath. 12/18/17  Yes Merlyn Lot, MD  azithromycin (ZITHROMAX) 500 MG  tablet Take 1 tablet (500 mg total) by mouth daily. X 4 more days 08/11/17  Yes Gladstone Lighter, MD  cefUROXime (CEFTIN) 500 MG tablet Take 1 tablet (500 mg total) by mouth 2 (two) times daily with a meal. X 7 days 08/10/17  Yes Gladstone Lighter, MD  ibuprofen (ADVIL,MOTRIN) 200 MG tablet Take 200 mg by mouth every 6 (six) hours as needed for fever, headache or mild pain.    Yes [provider]  Phenyleph-Doxylamine-DM-APAP (ALKA SELTZER PLUS PO) Take 1 tablet by mouth daily as needed (cold or flu symptoms).    Yes [provider]      VITAL SIGNS:  Blood pressure 140/81, pulse 93, temperature 98 F (36.7 C), temperature source Oral, resp. rate (!) 24, weight 59.9 kg, SpO2 94 %.  PHYSICAL EXAMINATION:  Physical Exam  Constitutional: He is oriented to person, place, and time. He appears well-developed and well-nourished. He is active and cooperative.  Non-toxic appearance. He does not have a sickly appearance. He does not appear ill. No distress. He is not intubated.  HENT:  Head: Normocephalic and atraumatic.  Mouth/Throat: Oropharynx is clear and moist. No oropharyngeal exudate.  Eyes: Conjunctivae, EOM and lids are normal. No scleral icterus.  Neck: Neck supple. No JVD present. No thyromegaly present.  Cardiovascular: Normal rate, regular rhythm, S1 normal, S2 normal and normal heart sounds.  No extrasystoles are present. Exam reveals no gallop, no S3, no S4, no distant heart sounds and no friction rub.  No murmur heard. Pulmonary/Chest: Effort normal. No accessory muscle usage or stridor. Tachypnea noted. No apnea and no bradypnea. He is not intubated. No respiratory distress. He has decreased breath sounds in the right upper field, the right middle field, the right lower field, the left upper field, the left middle field and the left lower field. He has no wheezes. He has no rhonchi. He has no rales.  Abdominal: Soft. Bowel sounds are normal. He exhibits no distension.  There is tenderness in the epigastric area. There is no rigidity, no rebound and no guarding.  Musculoskeletal: Normal range of motion. He exhibits no edema or tenderness.  Lymphadenopathy:    He has no cervical adenopathy.  Neurological: He is alert and oriented to person, place, and time. He is not disoriented.  Skin: Skin is warm, dry and intact. No rash noted. He is not diaphoretic. No erythema.  Psychiatric: He has a normal mood and affect. His speech is normal and behavior is normal. Judgment and thought content normal. Cognition and memory are normal.   Non-tachycardic on exam, HR 90s. (-) diaphoresis/tremor. Awake/alert; falls asleep multiple times during interview, but is easily arousable. LABORATORY PANEL:   CBC Recent Labs  Lab 08/11/18 1922  WBC 7.2  HGB 16.5  HCT 47.6  PLT 190   ------------------------------------------------------------------------------------------------------------------  Chemistries  Recent Labs  Lab 08/11/18 2201  NA 137  K 5.1  CL 99  CO2 20*  GLUCOSE 151*  BUN 12  CREATININE 0.96  CALCIUM 9.0  MG 2.2  AST 87*  ALT 35  ALKPHOS 69  BILITOT 1.8*   ------------------------------------------------------------------------------------------------------------------  Cardiac Enzymes Recent Labs  Lab 08/11/18 1922  TROPONINI <0.03   ------------------------------------------------------------------------------------------------------------------  RADIOLOGY:  No results found.  IMPRESSION AND PLAN:   A/P: 72M w/ EtOH abuse/dependence p/w 2d Hx N/V/AP, lightheadedness. Lipase 70, suspect acute EtOH pancreatitis. Anion gap metabolic acidosis, suspected 2/2 EtOH ketoacidosis, starvation ketosis. Dehydration, hyperglycemia, mild transaminasemia + hyperbilirubinemia. -N/V/AP, pancreatitis: Lipase 70. Suspect mild EtOH pancreatitis. IVF, pain ctrl, antiemetic, diet as tolerated. -Dehydration, lightheadedness: IVF. Tele, cardiac monitoring.  Orthostatic VS, FSG qHS/AC, neuro checks, fall precautions. -EtOH abuse/dependence: CIWA, Ativan PRN. Thiamine, folate, MVI. -Metabolic acidosis: AGAP 18. Likely 2/2 EtOH ketoacidosis, starvation ketosis. IVF. Monitor BMP. -Transaminasemia, hyperbilirubinemia: Likely 2/2 EtOH abuse. Mild, AST > 2x ULN but < 3x ULN. TBili 1.5. IVF, as above. -Persistent cough: Recently treated w/ outpt ABx. Pt states he completed the course. Lung diminished but clear on exam. CXR pending. -No Tylenol. -c/w other home meds. -FEN/GI: Clears, advance as tolerated. -DVT PPx: Lovenox -Code status: Full code. -Disposition: Observation, < 2 midnights   All the records are reviewed and case discussed with ED provider. Management plans discussed with the patient, family and they are in agreement.  CODE STATUS: Full code.  TOTAL TIME TAKING CARE OF THIS PATIENT: 75 minutes.    Arta Silence M.D on 08/12/2018 at 12:03 AM  Between 7am to 6pm - Pager - [phone]  After 6pm go to [email or web address] - [employer] brewer  [facility]  Office  [phone]  CC: Primary care physician; [facility] [place]   Note: This dictation was prepared with Diplomatic Services operational officer dictation along with smaller phrase technology. Any transcriptional errors that result from this process are unintentional.

## 2018-08-12 NOTE — Progress Notes (Signed)
Discharge instructions reviewed with PT and family no questions at time of discharge. Pt sister and POA requested ambulatory resources for substance abuse rehab. Pt accepted information for  resources. Resource information also provided and reviewed with discharge instructions

## 2018-08-13 LAB — HIV ANTIBODY (ROUTINE TESTING W REFLEX): HIV Screen 4th Generation wRfx: NONREACTIVE

## 2018-08-22 NOTE — Discharge Summary (Signed)
[place] at [place] NAME: Andre Rios    MR#:  [id number]  DATE OF BIRTH:  1950/12/24  DATE OF ADMISSION:  08/11/2018 ADMITTING PHYSICIAN: Arta Silence, MD  DATE OF DISCHARGE: 08/12/2018  3:00 PM  PRIMARY CARE PHYSICIAN: [facility] [place]   ADMISSION DIAGNOSIS:  Cough [O13] Alcoholic ketoacidosis [Y86.5] Orthostasis [I95.1] Hyperglycemia [R73.9]  DISCHARGE DIAGNOSIS:  Active Problems:   Nausea & vomiting   SECONDARY DIAGNOSIS:   Past Medical History:  Diagnosis Date  . Alcohol abuse   . Asthma   . COPD (chronic obstructive pulmonary disease) ([place])   . Pneumonia   . Seasonal allergies      ADMITTING HISTORY  HISTORY OF PRESENT ILLNESS:  Andre Rios  is a 67 y.o. male with a known history of COPD, EtOH abuse p/w 2d Hx lightheadedness, N/V/AP. He is a poor historian, giving a vague meandering history w/ unclear timeframe of symptom onset. He also falls asleep multiple times during Hx/ROS. From what I gather, pt drinks ~4x 40oz beer/day. He is retired, and states he started drinking in the late morning or around midday. He states he felt fine on Monday 08/26, but states he began to experience positional lightheadedness on Tuesday (08/27) morning. He first noticed it when he got up to go to the bathroom. He states later in the morning he developed N/V. He denies hematemesis. He also endorses some mild epigastric discomfort. He denies diarrhea or blood in the stool. He endorses decreased PO intake and dehydration. His sister brought him to the hospital. He states his last drink was sometime around midday on Tuesday 08/27. Endorses unrelated persistent cough, denies SOB/wheezing. States he recently completed a course of outpt ABx. I am unable to obtain any other information. Pt appears comfortable, does not appear septic/toxic, and is in no acute distress.  HOSPITAL COURSE:   *Mild alcoholic  pancreatitis *Dehydration *Starvation ketosis and elevated anion gap  Patient admitted to medical floor and aggressively fluid resuscitated.  With this his dehydration resolved and anion gap closed.  Patient did not have any abdominal pain by day of discharge and is tolerating food.  Counseled to quit alcohol.  Discharged home in stable condition to follow-up with primary care physician.  CONSULTS OBTAINED:  Treatment Team:  Arta Silence, MD  DRUG ALLERGIES:  No Known Allergies  DISCHARGE MEDICATIONS:   Allergies as of 08/12/2018   No Known Allergies     Medication List    TAKE these medications   acetaminophen 500 MG tablet Commonly known as:  TYLENOL Take 500 mg by mouth every 6 (six) hours as needed for mild pain, fever or headache.   albuterol 108 (90 Base) MCG/ACT inhaler Commonly known as:  PROVENTIL HFA;VENTOLIN HFA Inhale 2 puffs into the lungs every 4 (four) hours as needed for wheezing or shortness of breath.   ALKA SELTZER PLUS PO Take 1 tablet by mouth daily as needed (cold or flu symptoms).   azithromycin 500 MG tablet Commonly known as:  ZITHROMAX Take 1 tablet (500 mg total) by mouth daily. X 4 more days   cefUROXime 500 MG tablet Commonly known as:  CEFTIN Take 1 tablet (500 mg total) by mouth 2 (two) times daily with a meal. X 7 days   fluticasone 50 MCG/ACT nasal spray Commonly known as:  FLONASE Place 1 spray into both nostrils daily.   ibuprofen 200 MG tablet Commonly known as:  ADVIL,MOTRIN Take 200 mg by mouth  every 6 (six) hours as needed for fever, headache or mild pain.       Today   VITAL SIGNS:  Blood pressure 119/88, pulse 65, temperature 98 F (36.7 C), temperature source Oral, resp. rate 17, height 5\' 7"  (1.702 m), weight 52.1 kg, SpO2 100 %.  I/O:  No intake or output data in the 24 hours ending 08/22/18 1243  PHYSICAL EXAMINATION:  Physical Exam  GENERAL:  67 y.o.-year-old patient lying in the bed with no acute  distress.  LUNGS: Normal breath sounds bilaterally, no wheezing, rales,rhonchi or crepitation. No use of accessory muscles of respiration.  CARDIOVASCULAR: S1, S2 normal. No murmurs, rubs, or gallops.  ABDOMEN: Soft, non-tender, non-distended. Bowel sounds present. No organomegaly or mass.  NEUROLOGIC: Moves all 4 extremities. PSYCHIATRIC: The patient is alert and oriented x 3.  SKIN: No obvious rash, lesion, or ulcer.   DATA REVIEW:   CBC No results for input(s): WBC, HGB, HCT, PLT in the last 168 hours.  Chemistries  No results for input(s): NA, K, CL, CO2, GLUCOSE, BUN, CREATININE, CALCIUM, MG, AST, ALT, ALKPHOS, BILITOT in the last 168 hours.  Invalid input(s): GFRCGP  Cardiac Enzymes No results for input(s): TROPONINI in the last 168 hours.  Microbiology Results  Results for orders placed or performed during the hospital encounter of 08/09/17  Culture, blood (routine x 2) Call MD if unable to obtain prior to antibiotics being given     Status: None   Collection Time: 08/09/17  8:17 PM  Result Value Ref Range Status   Specimen Description BLOOD RIGHT ARM  Final   Special Requests   Final    BOTTLES DRAWN AEROBIC AND ANAEROBIC Blood Culture adequate volume   Culture NO GROWTH 5 DAYS  Final   Report Status 08/14/2017 FINAL  Final  Culture, blood (routine x 2) Call MD if unable to obtain prior to antibiotics being given     Status: None   Collection Time: 08/09/17  8:29 PM  Result Value Ref Range Status   Specimen Description BLOOD LEFT HAND  Final   Special Requests   Final    BOTTLES DRAWN AEROBIC AND ANAEROBIC Blood Culture adequate volume   Culture NO GROWTH 5 DAYS  Final   Report Status 08/14/2017 FINAL  Final    RADIOLOGY:  No results found.  Follow up with PCP in 1 week.  Management plans discussed with the patient, family and they are in agreement.  CODE STATUS:  Code Status History    Date Active Date Inactive Code Status Order ID Comments User Context    08/12/2018 0113 08/12/2018 1833 Full Code [id number]  Arta Silence, MD Inpatient   08/09/2017 1826 08/10/2017 1804 Full Code [id number]  Nicholes Mango, MD Inpatient      TOTAL TIME TAKING CARE OF THIS PATIENT ON DAY OF DISCHARGE: more than 30 minutes.   Leia Alf Merranda Bolls M.D on 08/22/2018 at 12:43 PM  Between 7am to 6pm - Pager - [phone]  After 6pm go to [email or web address] - password [email or web address] [place] [facility]  Office  [phone]  CC: Primary care physician; [facility] [place]  Note: This dictation was prepared with Diplomatic Services operational officer dictation along with smaller phrase technology. Any transcriptional errors that result from this process are unintentional.

## 2018-12-23 ENCOUNTER — Encounter: Payer: Self-pay | Admitting: *Deleted

## 2018-12-23 ENCOUNTER — Emergency Department: Payer: Medicare HMO

## 2018-12-23 ENCOUNTER — Other Ambulatory Visit: Payer: Self-pay

## 2018-12-23 ENCOUNTER — Emergency Department
Admission: EM | Admit: 2018-12-23 | Discharge: 2018-12-23 | Disposition: A | Discharge status: Home / Self Care | Payer: Medicare HMO | Attending: Emergency Medicine | Admitting: Emergency Medicine

## 2018-12-23 DIAGNOSIS — J45909 Unspecified asthma, uncomplicated: Secondary | ICD-10-CM | POA: Insufficient documentation

## 2018-12-23 DIAGNOSIS — J069 Acute upper respiratory infection, unspecified: Secondary | ICD-10-CM | POA: Diagnosis not present

## 2018-12-23 DIAGNOSIS — F17210 Nicotine dependence, cigarettes, uncomplicated: Secondary | ICD-10-CM | POA: Insufficient documentation

## 2018-12-23 DIAGNOSIS — J449 Chronic obstructive pulmonary disease, unspecified: Secondary | ICD-10-CM | POA: Diagnosis not present

## 2018-12-23 DIAGNOSIS — Z79899 Other long term (current) drug therapy: Secondary | ICD-10-CM | POA: Insufficient documentation

## 2018-12-23 DIAGNOSIS — R0981 Nasal congestion: Secondary | ICD-10-CM | POA: Diagnosis present

## 2018-12-23 LAB — BASIC METABOLIC PANEL
Anion gap: 15 (ref 5–15)
BUN: 9 mg/dL (ref 8–23)
CALCIUM: 9.3 mg/dL (ref 8.9–10.3)
CO2: 22 mmol/L (ref 22–32)
Chloride: 100 mmol/L (ref 98–111)
Creatinine, Ser: 0.88 mg/dL (ref 0.61–1.24)
GFR calc Af Amer: >60 mL/min (ref >60)
GFR calc non Af Amer: >60 mL/min (ref >60)
Glucose, Bld: 122 mg/dL — ABNORMAL HIGH (ref 70–99)
Potassium: 4 mmol/L (ref 3.5–5.1)
Sodium: 137 mmol/L (ref 135–145)

## 2018-12-23 LAB — CBC
HCT: 42.8 % (ref 39.0–52.0)
Hemoglobin: 14.1 g/dL (ref 13.0–17.0)
MCH: 30.1 pg (ref 26.0–34.0)
MCHC: 32.9 g/dL (ref 30.0–36.0)
MCV: 91.3 fL (ref 80.0–100.0)
Platelets: 199 10*3/uL (ref 150–400)
RBC: 4.69 MIL/uL (ref 4.22–5.81)
RDW: 13.8 % (ref 11.5–15.5)
WBC: 4.6 10*3/uL (ref 4.0–10.5)
nRBC: 0 % (ref 0.0–0.2)

## 2018-12-23 LAB — URINALYSIS, COMPLETE (UACMP) WITH MICROSCOPIC
BILIRUBIN URINE: NEGATIVE
Bacteria, UA: NONE SEEN
Glucose, UA: NEGATIVE mg/dL
Hgb urine dipstick: NEGATIVE
KETONES UR: 20 mg/dL — AB
LEUKOCYTES UA: NEGATIVE
Nitrite: NEGATIVE
Protein, ur: 100 mg/dL — AB
Specific Gravity, Urine: 1.023 (ref 1.005–1.030)
pH: 6 (ref 5.0–8.0)

## 2018-12-23 LAB — GLUCOSE, CAPILLARY: Glucose-Capillary: 109 mg/dL — ABNORMAL HIGH (ref 70–99)

## 2018-12-23 LAB — TROPONIN I: Troponin I: <0.03 ng/mL (ref <0.03)

## 2018-12-23 MED ORDER — IPRATROPIUM-ALBUTEROL 0.5-2.5 (3) MG/3ML IN SOLN
3.0000 mL | Freq: Once | RESPIRATORY_TRACT | Status: AC
  Administered 2018-12-23: 3 mL via RESPIRATORY_TRACT
  Filled 2018-12-23: qty 3

## 2018-12-23 MED ORDER — BENZONATATE 100 MG PO CAPS: 100.0000 mg | ORAL_CAPSULE | Freq: Four times a day (QID) | ORAL | 0 refills | Status: AC | PRN

## 2018-12-23 NOTE — ED Notes (Signed)
No peripheral IV placed this visit.    Discharge instructions reviewed with patient. Questions fielded by this RN. Patient verbalizes understanding of instructions. Patient discharged home in stable condition per goodman. No acute distress noted at time of discharge.    

## 2018-12-23 NOTE — ED Triage Notes (Signed)
Pt to ED reporting 3-4 months of weakness that has now developed into a cough and shoulder pains. PT verbalized 3 episodes of vomiting today and feeling "feverish" at home. Pt reports the lightheadedness has worsened recently and pt is concerned due to drinking and smoking hx. Pt reports he drinks between 12 oz - 40 oz of beer a day and smokes a pack a day.

## 2018-12-23 NOTE — ED Notes (Addendum)
Pt c/o cold sxs x1 year - reports nausea and vomiting today (vomited x2) and felt lightheaded - reports cough and runny nose with sinus pressure Reports that he "needs to stop smoking and drinking and go back to eating properly" - smokes 1 pack every day - drinks 40oz and 1 can beer every day

## 2018-12-23 NOTE — Discharge Instructions (Addendum)
Please seek medical attention for any high fevers, chest pain, shortness of breath, change in behavior, persistent vomiting, bloody stool or any other new or concerning symptoms.  

## 2018-12-23 NOTE — ED Provider Notes (Signed)
[facility] Emergency Department Provider Note   ____________________________________________   I have reviewed the triage vital signs and the nursing notes.   HISTORY  Chief Complaint Weakness and Emesis   History limited by: Not Limited   HPI Andre Rios is a 68 y.o. male who presents to the emergency department today because of concerns for congestion and cold-like symptoms as well as some vomiting.  Patient states that he has had congestion cold-like symptoms for a number of years.  He states he does have a long history of smoking.  The patient states that for the past couple days it has been slightly worse.  Additionally he is concerned for some episodes of vomiting.  He denies any blood in his vomit.  He has not had any abdominal pain with the vomiting.  Denies any diarrhea.   Per medical record review patient has a history of COPD  Past Medical History:  Diagnosis Date  . Alcohol abuse   . Asthma   . COPD (chronic obstructive pulmonary disease) ([place])   . Pneumonia   . Seasonal allergies     Patient Active Problem List   Diagnosis Date Noted  . Nausea & vomiting 08/11/2018  . PNA (pneumonia) 08/09/2017  . 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 Alcohol abuse 08/08/2016  . Involuntary commitment 08/08/2016  . 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 EYE SURGERY      Prior to Admission medications   Medication Sig Start Date End Date Taking? Authorizing Provider  acetaminophen (TYLENOL) 500 MG tablet Take 500 mg by mouth every 6 (six) hours as needed for mild pain, fever or headache.     [provider]  albuterol (PROVENTIL HFA;VENTOLIN HFA) 108 (90 Base) MCG/ACT inhaler Inhale 2 puffs into the lungs every 4 (four) hours as needed for wheezing or shortness of breath. 12/18/17   Merlyn Lot, MD  azithromycin (ZITHROMAX) 500 MG tablet Take 1 tablet (500 mg total) by mouth daily. X 4  more days 08/11/17   Gladstone Lighter, MD  cefUROXime (CEFTIN) 500 MG tablet Take 1 tablet (500 mg total) by mouth 2 (two) times daily with a meal. X 7 days 08/10/17   Gladstone Lighter, MD  fluticasone (FLONASE) 50 MCG/ACT nasal spray Place 1 spray into both nostrils daily. 08/12/18 08/12/19  Hillary Bow, MD  ibuprofen (ADVIL,MOTRIN) 200 MG tablet Take 200 mg by mouth every 6 (six) hours as needed for fever, headache or mild pain.     [provider]  Phenyleph-Doxylamine-DM-APAP (ALKA SELTZER PLUS PO) Take 1 tablet by mouth daily as needed (cold or flu symptoms).     [provider]    Allergies Patient has no known allergies.  History reviewed. No pertinent family history.  Social History Social History   Tobacco Use  . Smoking status: Current Every Day Smoker  . Smokeless tobacco: Never Used  Substance Use Topics  . Alcohol use: Yes    Alcohol/week: 5.0 standard drinks    Types: 5 Cans of beer per week    Comment: several times a week  . Drug use: No    Review of Systems Constitutional: Positive for subjective fevers Eyes: No visual changes. ENT: No sore throat. Cardiovascular: Denies chest pain. Respiratory: Positive for shortness of breath and cough Gastrointestinal: No abdominal pain.  Positive for vomiting Genitourinary: Negative for dysuria. Musculoskeletal: Negative for back pain. Skin: Negative for rash. Neurological: Negative for headaches, focal weakness  or numbness.  ____________________________________________   PHYSICAL EXAM:  VITAL SIGNS: ED Triage Vitals  Enc Vitals Group     BP --      Pulse Rate 12/23/18 1351 90     Resp 12/23/18 1351 16     Temp 12/23/18 1351 98.3 F (36.8 C)     Temp Source 12/23/18 1351 Oral     SpO2 12/23/18 1351 98 %     Weight 12/23/18 1352 110 lb (49.9 kg)     Height 12/23/18 1352 5\' 6"  (1.676 m)     Head Circumference --      Peak Flow --      Pain Score 12/23/18 1352 5   Constitutional: Alert  and oriented.  Eyes: Conjunctivae are normal.  ENT      Head: Normocephalic and atraumatic.      Nose: No congestion/rhinnorhea.      Mouth/Throat: Mucous membranes are moist.      Neck: No stridor. Hematological/Lymphatic/Immunilogical: No cervical lymphadenopathy. Cardiovascular: Normal rate, regular rhythm.  No murmurs, rubs, or gallops. Respiratory: Normal respiratory effort without tachypnea nor retractions. Breath sounds are clear and equal bilaterally. No wheezes/rales/rhonchi. Gastrointestinal: Soft and non tender. No rebound. No guarding.  Genitourinary: Deferred Musculoskeletal: Normal range of motion in all extremities. No lower extremity edema. Neurologic:  Normal speech and language. No gross focal neurologic deficits are appreciated.  Skin:  Skin is warm, dry and intact. No rash noted. Psychiatric: Mood and affect are normal. Speech and behavior are normal. Patient exhibits appropriate insight and judgment.  ____________________________________________    LABS (pertinent positives/negatives)  BMP wnl except glu 122 CBC wbc 4.6, hgb 14.1, plt 199 UA clear, 20 ketones, 100 protein, negative nitraite and leukocytes, 0-5 rbc and wbc Trop <0.03 ____________________________________________   EKG  I, Nance Pear, attending physician, personally viewed and interpreted this EKG  EKG Time: 1349 Rate: 81 Rhythm: normal sinus rhythm Axis: normal Intervals: qtc 446 QRS: narrow ST changes: no st elevation Impression: normal ekg   ____________________________________________    RADIOLOGY  CXR Stable emphasyma pattern   ____________________________________________   PROCEDURES  Procedures  ____________________________________________   INITIAL IMPRESSION / ASSESSMENT AND PLAN / ED COURSE  Pertinent labs & imaging results that were available during my care of the patient were reviewed by me and considered in my medical decision making (see chart for  details).   Patient presented to the emergency department today because of concerns for upper respiratory infection type symptoms as well as some vomiting.  Patient's blood work EKG and chest x-ray without any acute concerning findings.  He did feel better after breathing treatment.  At this point I think viral URI likely.  I discussed this with the patient.  Will prescribe cough medication.  Also give patient information on detox facility in the area.  ____________________________________________   FINAL CLINICAL IMPRESSION(S) / ED DIAGNOSES  Final diagnoses:  Chronic obstructive pulmonary disease, unspecified COPD type ([place])  Viral URI     Note: This dictation was prepared with Dragon dictation. Any transcriptional errors that result from this process are unintentional     Nance Pear, MD 12/23/18 2030

## 2020-07-14 ENCOUNTER — Encounter: Payer: Self-pay | Admitting: Radiology

## 2020-07-14 ENCOUNTER — Other Ambulatory Visit: Payer: Self-pay

## 2020-07-14 ENCOUNTER — Emergency Department: Payer: Medicare HMO

## 2020-07-14 DIAGNOSIS — J449 Chronic obstructive pulmonary disease, unspecified: Secondary | ICD-10-CM | POA: Insufficient documentation

## 2020-07-14 DIAGNOSIS — Y939 Activity, unspecified: Secondary | ICD-10-CM | POA: Insufficient documentation

## 2020-07-14 DIAGNOSIS — Y929 Unspecified place or not applicable: Secondary | ICD-10-CM | POA: Insufficient documentation

## 2020-07-14 DIAGNOSIS — S42212A Unspecified displaced fracture of surgical neck of left humerus, initial encounter for closed fracture: Secondary | ICD-10-CM | POA: Insufficient documentation

## 2020-07-14 DIAGNOSIS — Z7951 Long term (current) use of inhaled steroids: Secondary | ICD-10-CM | POA: Diagnosis not present

## 2020-07-14 DIAGNOSIS — Y999 Unspecified external cause status: Secondary | ICD-10-CM | POA: Insufficient documentation

## 2020-07-14 DIAGNOSIS — W010XXA Fall on same level from slipping, tripping and stumbling without subsequent striking against object, initial encounter: Secondary | ICD-10-CM | POA: Diagnosis not present

## 2020-07-14 DIAGNOSIS — F17200 Nicotine dependence, unspecified, uncomplicated: Secondary | ICD-10-CM | POA: Insufficient documentation

## 2020-07-14 DIAGNOSIS — Z79899 Other long term (current) drug therapy: Secondary | ICD-10-CM | POA: Insufficient documentation

## 2020-07-14 DIAGNOSIS — J45909 Unspecified asthma, uncomplicated: Secondary | ICD-10-CM | POA: Diagnosis not present

## 2020-07-14 DIAGNOSIS — S40912A Unspecified superficial injury of left shoulder, initial encounter: Secondary | ICD-10-CM | POA: Diagnosis present

## 2020-07-14 NOTE — ED Triage Notes (Signed)
Patient reports he was at a cook out and he had been drinking and he tripped and fell.  Patient reports having left shoulder pain.

## 2020-07-15 ENCOUNTER — Emergency Department
Admission: EM | Admit: 2020-07-15 | Discharge: 2020-07-15 | Disposition: A | Discharge status: Home / Self Care | Payer: Medicare HMO | Attending: Emergency Medicine | Admitting: Emergency Medicine

## 2020-07-15 ENCOUNTER — Emergency Department: Payer: Medicare HMO

## 2020-07-15 DIAGNOSIS — S42212A Unspecified displaced fracture of surgical neck of left humerus, initial encounter for closed fracture: Secondary | ICD-10-CM

## 2020-07-15 DIAGNOSIS — W19XXXA Unspecified fall, initial encounter: Secondary | ICD-10-CM

## 2020-07-15 MED ORDER — ACETAMINOPHEN 500 MG PO TABS
1000.0000 mg | ORAL_TABLET | Freq: Once | ORAL | Status: AC
  Administered 2020-07-15: 1000 mg via ORAL
  Filled 2020-07-15: qty 2

## 2020-07-15 NOTE — ED Notes (Signed)
Patient transported to X-ray 

## 2020-07-15 NOTE — ED Notes (Signed)
Dr. Alfred Levins in triage to see patient.

## 2020-07-15 NOTE — ED Provider Notes (Signed)
[facility] Emergency Department Provider Note  ____________________________________________  Time seen: Approximately 3:03 AM  I have reviewed the triage vital signs and the nursing notes.   HISTORY  Chief Complaint Fall and Shoulder Pain   HPI Andre Rios is a 69 y.o. male the history of alcohol abuse, COPD, asthma who presents for evaluation of left shoulder pain.  Patient reports that he had a fall earlier today.  He was drinking, tripped and fell.  He is complaining of left shoulder pain and left hand pain.  He reports that the pain is 0 if he does not try to move his arm but he becomes mild if he tries to.  He is having difficulty moving his shoulder since the fall.  He denies head trauma or LOC.  He denies neck pain or back pain, lower extremity pain, chest pain.   Past Medical History:  Diagnosis Date  . Alcohol abuse   . Asthma   . COPD (chronic obstructive pulmonary disease) ([place])   . Pneumonia   . Seasonal allergies     Patient Active Problem List   Diagnosis Date Noted  . Nausea & vomiting 08/11/2018  . PNA (pneumonia) 08/09/2017  . 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 Alcohol abuse 08/08/2016  . Involuntary commitment 08/08/2016  . 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 EYE SURGERY      Prior to Admission medications   Medication Sig Start Date End Date Taking? Authorizing Provider  acetaminophen (TYLENOL) 500 MG tablet Take 500 mg by mouth every 6 (six) hours as needed for mild pain, fever or headache.     [provider]  albuterol (PROVENTIL HFA;VENTOLIN HFA) 108 (90 Base) MCG/ACT inhaler Inhale 2 puffs into the lungs every 4 (four) hours as needed for wheezing or shortness of breath. 12/18/17   Merlyn Lot, MD  azithromycin (ZITHROMAX) 500 MG tablet Take 1 tablet (500 mg total) by mouth daily. X 4 more days 08/11/17   Gladstone Lighter, MD  cefUROXime (CEFTIN) 500  MG tablet Take 1 tablet (500 mg total) by mouth 2 (two) times daily with a meal. X 7 days 08/10/17   Gladstone Lighter, MD  fluticasone (FLONASE) 50 MCG/ACT nasal spray Place 1 spray into both nostrils daily. 08/12/18 08/12/19  Hillary Bow, MD  ibuprofen (ADVIL,MOTRIN) 200 MG tablet Take 200 mg by mouth every 6 (six) hours as needed for fever, headache or mild pain.     [provider]  Phenyleph-Doxylamine-DM-APAP (ALKA SELTZER PLUS PO) Take 1 tablet by mouth daily as needed (cold or flu symptoms).     [provider]    Allergies Patient has no known allergies.  No family history on file.  Social History Social History   Tobacco Use  . Smoking status: Current Every Day Smoker  . Smokeless tobacco: Never Used  Substance Use Topics  . Alcohol use: Yes    Alcohol/week: 5.0 standard drinks    Types: 5 Cans of beer per week    Comment: several times a week  . Drug use: No    Review of Systems  Constitutional: Negative for fever. Eyes: Negative for visual changes. ENT: Negative for facial injury or neck injury Cardiovascular: Negative for chest injury. Respiratory: Negative for shortness of breath. Negative for chest wall injury. Gastrointestinal: Negative for abdominal pain or injury. Genitourinary: Negative for dysuria. Musculoskeletal: Negative for back injury, + L hand and shoulder pain Skin: Negative for  laceration/abrasions. Neurological: Negative for head injury.   ____________________________________________   PHYSICAL EXAM:  VITAL SIGNS: ED Triage Vitals  Enc Vitals Group     BP 07/14/20 2228 (!) 131/75     Pulse Rate 07/14/20 2228 89     Resp 07/14/20 2228 18     Temp 07/14/20 2228 98.1 F (36.7 C)     Temp Source 07/14/20 2228 Oral     SpO2 07/14/20 2228 94 %     Weight 07/14/20 2229 115 lb (52.2 kg)     Height 07/14/20 2229 5\' 6"  (1.676 m)     Head Circumference --      Peak Flow --      Pain Score 07/14/20 2229 10     Pain Loc --       Pain Edu? --      Excl. in [place]? --     Full spinal precautions maintained throughout the trauma exam. Constitutional: Alert and oriented. No acute distress. Does not appear intoxicated. HEENT Head: Normocephalic and atraumatic. Face: No facial bony tenderness. Stable midface Ears: No hemotympanum bilaterally. No Battle sign Eyes: No eye injury. PERRL. No raccoon eyes Nose: Nontender. No epistaxis. No rhinorrhea Mouth/Throat: Mucous membranes are moist. No oropharyngeal blood. No dental injury. Airway patent without stridor. Normal voice. Neck: no C-collar. No midline c-spine tenderness.  Cardiovascular: Normal rate, regular rhythm. Normal and symmetric distal pulses are present in all extremities. Pulmonary/Chest: Chest wall is stable and nontender to palpation/compression. Normal respiratory effort. Breath sounds are normal. No crepitus.  Abdominal: Soft, nontender, non distended. Musculoskeletal: Mild swelling of the dorsum of the left hand, mild tenderness to palpation of the proximal humerus.  Normal neurovascular exam.  Nontender with normal full range of motion in all other extremities. No deformities. No thoracic or lumbar midline spinal tenderness. Pelvis is stable. Skin: Skin is warm, dry and intact. No abrasions or contutions. Psychiatric: Speech and behavior are appropriate. Neurological: Normal speech and language. Moves all extremities to command. No gross focal neurologic deficits are appreciated.  Glascow Coma Score: 4 - Opens eyes on own 6 - Follows simple motor commands 5 - Alert and oriented GCS: 15   ____________________________________________   LABS (all labs ordered are listed, but only abnormal results are displayed)  Labs Reviewed - No data to display ____________________________________________  EKG  none  ____________________________________________  RADIOLOGY  I have personally reviewed the images performed during this visit and I agree with  the Radiologist's read.   Interpretation by Radiologist:  DG Shoulder Left  Result Date: 07/14/2020 CLINICAL DATA:  Recent trip and fall with shoulder pain, initial encounter EXAM: LEFT SHOULDER - 2+ VIEW COMPARISON:  None. FINDINGS: There is a comminuted fracture of the proximal left humerus involving primarily the surgical neck. Mild impaction at the fracture site is noted. Humeral head is well seated in the glenoid. No other focal abnormality is noted. IMPRESSION: Comminuted fracture of the proximal left humerus involving primarily the surgical neck. Electronically Signed   By: Inez Catalina M.D.   On: 07/14/2020 23:13   DG Hand Complete Left  Result Date: 07/15/2020 CLINICAL DATA:  Recent fall with pain and swelling, initial encounter EXAM: LEFT HAND - COMPLETE 3+ VIEW COMPARISON:  None. FINDINGS: There is no evidence of fracture or dislocation. There is no evidence of arthropathy or other focal bone abnormality. Soft tissues are unremarkable. IMPRESSION: No acute abnormality noted. Electronically Signed   By: Inez Catalina M.D.   On: 07/15/2020 02:14  ____________________________________________   PROCEDURES  Procedure(s) performed: None Procedures Critical Care performed:  None ____________________________________________   INITIAL IMPRESSION / ASSESSMENT AND PLAN / ED COURSE   69 y.o. male the history of alcohol abuse, COPD, asthma who presents for evaluation of left shoulder pain.  X-ray showing a left proximal humeral fracture, confirmed by radiology.  X-ray of the hand negative.  Patient was placed on a sling.  Pain well controlled with p.o. Tylenol.  Referred to [facility].  Discussed my standard return precautions with patient.  No other injuries based on history and physical.  Old medical records reviewed.        ____________________________________________  Please note:  Patient was evaluated in Emergency Department today for the symptoms described in the history of  present illness. Patient was evaluated in the context of the global COVID-19 pandemic, which necessitated consideration that the patient might be at risk for infection with the SARS-CoV-2 virus that causes COVID-19. Institutional protocols and algorithms that pertain to the evaluation of patients at risk for COVID-19 are in a state of rapid change based on information released by regulatory bodies including the CDC and federal and state organizations. These policies and algorithms were followed during the patient's care in the ED.  Some ED evaluations and interventions may be delayed as a result of limited staffing during the pandemic.   ____________________________________________   FINAL CLINICAL IMPRESSION(S) / ED DIAGNOSES   Final diagnoses:  Fall, initial encounter  Closed displaced fracture of surgical neck of left humerus, unspecified fracture morphology, initial encounter      NEW MEDICATIONS STARTED DURING THIS VISIT:  ED Discharge Orders    None       Note:  This document was prepared using Dragon voice recognition software and may include unintentional dictation errors.    Alfred Levins, [place], MD 07/15/20 [phone]

## 2020-11-12 ENCOUNTER — Encounter: Payer: Self-pay | Admitting: Emergency Medicine

## 2020-11-12 ENCOUNTER — Emergency Department
Admission: EM | Admit: 2020-11-12 | Discharge: 2020-11-12 | Disposition: A | Discharge status: Home / Self Care | Payer: Medicare HMO | Attending: Emergency Medicine | Admitting: Emergency Medicine

## 2020-11-12 ENCOUNTER — Other Ambulatory Visit: Payer: Self-pay

## 2020-11-12 ENCOUNTER — Emergency Department: Payer: Medicare HMO

## 2020-11-12 DIAGNOSIS — R519 Headache, unspecified: Secondary | ICD-10-CM | POA: Insufficient documentation

## 2020-11-12 DIAGNOSIS — J45909 Unspecified asthma, uncomplicated: Secondary | ICD-10-CM | POA: Insufficient documentation

## 2020-11-12 DIAGNOSIS — J449 Chronic obstructive pulmonary disease, unspecified: Secondary | ICD-10-CM | POA: Diagnosis not present

## 2020-11-12 DIAGNOSIS — F17200 Nicotine dependence, unspecified, uncomplicated: Secondary | ICD-10-CM | POA: Insufficient documentation

## 2020-11-12 NOTE — ED Provider Notes (Signed)
[facility] Emergency Department Provider Note   ____________________________________________   First MD Initiated Contact with Patient 11/12/20 [phone]     (approximate)  I have reviewed the triage vital signs and the nursing notes.   HISTORY  Chief Complaint Headache    HPI Andre Rios is a 69 y.o. male with past medical history of COPD, alcohol abuse, and seasonal allergies who presents to the ED complaining of headache.  Patient reports that he fell about 1 month ago after slipping on some wet ground, after which he struck his head.  He does not think he lost consciousness and does not take a blood thinner.  He was evaluated the ED after the fall, but does not think he had imaging of his head at that time.  He states he has had intermittent headaches since the fall, which he describes as a sharp pain shooting up the right side of his head.  He denies any specific exacerbating or alleviating symptoms, pain is not associated with certain movements of his head.  He denies any vision changes, speech changes, numbness, or weakness.  He has not seen his PCP for this problem and has not been taking any medications for it.        Past Medical History:  Diagnosis Date  . Alcohol abuse   . Asthma   . COPD (chronic obstructive pulmonary disease) ([place])   . Pneumonia   . Seasonal allergies     Patient Active Problem List   Diagnosis Date Noted  . Nausea & vomiting 08/11/2018  . PNA (pneumonia) 08/09/2017  . 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 Alcohol abuse 08/08/2016  . Involuntary commitment 08/08/2016  . 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 EYE SURGERY      Prior to Admission medications   Medication Sig Start Date End Date Taking? Authorizing Provider  acetaminophen (TYLENOL) 500 MG tablet Take 500 mg by mouth every 6 (six) hours as needed for mild pain, fever or headache.     [provider]  albuterol (PROVENTIL HFA;VENTOLIN HFA) 108 (90 Base) MCG/ACT inhaler Inhale 2 puffs into the lungs every 4 (four) hours as needed for wheezing or shortness of breath. 12/18/17   Merlyn Lot, MD  azithromycin (ZITHROMAX) 500 MG tablet Take 1 tablet (500 mg total) by mouth daily. X 4 more days 08/11/17   Gladstone Lighter, MD  cefUROXime (CEFTIN) 500 MG tablet Take 1 tablet (500 mg total) by mouth 2 (two) times daily with a meal. X 7 days 08/10/17   Gladstone Lighter, MD  fluticasone (FLONASE) 50 MCG/ACT nasal spray Place 1 spray into both nostrils daily. 08/12/18 08/12/19  Hillary Bow, MD  ibuprofen (ADVIL,MOTRIN) 200 MG tablet Take 200 mg by mouth every 6 (six) hours as needed for fever, headache or mild pain.     [provider]  Phenyleph-Doxylamine-DM-APAP (ALKA SELTZER PLUS PO) Take 1 tablet by mouth daily as needed (cold or flu symptoms).     [provider]    Allergies Patient has no known allergies.  No family history on file.  Social History Social History   Tobacco Use  . Smoking status: Current Every Day Smoker  . Smokeless tobacco: Never Used  Substance Use Topics  . Alcohol use: Yes    Alcohol/week: 5.0 standard drinks    Types: 5 Cans of beer per week    Comment: several times a week  . Drug use:  No    Review of Systems  Constitutional: No fever/chills Eyes: No visual changes. ENT: No sore throat. Cardiovascular: Denies chest pain. Respiratory: Denies shortness of breath. Gastrointestinal: No abdominal pain.  No nausea, no vomiting.  No diarrhea.  No constipation. Genitourinary: Negative for dysuria. Musculoskeletal: Negative for back pain. Skin: Negative for rash. Neurological: Positive for headaches, negative for focal weakness or numbness.  ____________________________________________   PHYSICAL EXAM:  VITAL SIGNS: ED Triage Vitals [11/12/20 0857]  Enc Vitals Group     BP 139/72     Pulse Rate 88     Resp 18      Temp 98.5 F (36.9 C)     Temp Source Oral     SpO2 100 %     Weight 115 lb 1.3 oz (52.2 kg)     Height 5\' 6"  (1.676 m)     Head Circumference      Peak Flow      Pain Score 0     Pain Loc      Pain Edu?      Excl. in [place]?     Constitutional: Alert and oriented. Eyes: Conjunctivae are normal.  Pupils equal round and reactive to light. Head: Atraumatic. Nose: No congestion/rhinnorhea. Mouth/Throat: Mucous membranes are moist. Neck: Normal ROM, no midline cervical spine tenderness. Cardiovascular: Normal rate, regular rhythm. Grossly normal heart sounds. Respiratory: Normal respiratory effort.  No retractions. Lungs CTAB. Gastrointestinal: Soft and nontender. No distention. Genitourinary: deferred Musculoskeletal: No lower extremity tenderness nor edema. Neurologic:  Normal speech and language. No gross focal neurologic deficits are appreciated. Skin:  Skin is warm, dry and intact. No rash noted. Psychiatric: Mood and affect are normal. Speech and behavior are normal.  ____________________________________________   LABS (all labs ordered are listed, but only abnormal results are displayed)  Labs Reviewed - No data to display   PROCEDURES  Procedure(s) performed (including Critical Care):  Procedures   ____________________________________________   INITIAL IMPRESSION / ASSESSMENT AND PLAN / ED COURSE       69 year old male with past medical history of COPD, alcohol abuse, and seasonal allergies who presents to the ED complaining of intermittent shooting headaches affecting the right side of his head ever since a fall a couple of months ago.  He has no focal neurologic deficits on exam.  No cervical spine tenderness or limitation in range of motion to suggest cervical spine injury.  CT head was performed, images reviewed by me and no obvious hemorrhage, negative for acute process per radiology.  Patient states he was having the headache earlier but symptoms have now  resolved.  I doubt SAH or meningitis and he is appropriate for discharge home with neurology follow-up.  He was counseled to return to the ED for new worsening symptoms, patient agrees with plan.      ____________________________________________   FINAL CLINICAL IMPRESSION(S) / ED DIAGNOSES  Final diagnoses:  Recurrent headache     ED Discharge Orders    None       Note:  This document was prepared using Dragon voice recognition software and may include unintentional dictation errors.   Blake Divine, MD 11/12/20 1121

## 2020-11-12 NOTE — ED Triage Notes (Addendum)
Patient to ER for c/o headache. Patient states he fell approx one month ago (slipped on wet ground). States "I feel like something is moving around in there." Denies neck pain.

## 2021-05-20 ENCOUNTER — Other Ambulatory Visit: Payer: Self-pay

## 2021-05-20 ENCOUNTER — Encounter: Payer: Self-pay | Admitting: Ophthalmology

## 2021-05-29 ENCOUNTER — Ambulatory Visit
Admission: RE | Admit: 2021-05-29 | Discharge: 2021-05-29 | Disposition: A | Discharge status: Home / Self Care | Payer: Medicare HMO | Attending: Ophthalmology | Admitting: Ophthalmology

## 2021-05-29 ENCOUNTER — Ambulatory Visit: Payer: Medicare HMO | Admitting: Anesthesiology

## 2021-05-29 ENCOUNTER — Encounter
Admission: RE | Disposition: A | Discharge status: Home / Self Care | Payer: Self-pay | Source: Home / Self Care | Attending: Ophthalmology

## 2021-05-29 ENCOUNTER — Other Ambulatory Visit: Payer: Self-pay

## 2021-05-29 DIAGNOSIS — H2512 Age-related nuclear cataract, left eye: Secondary | ICD-10-CM | POA: Diagnosis present

## 2021-05-29 DIAGNOSIS — F17210 Nicotine dependence, cigarettes, uncomplicated: Secondary | ICD-10-CM | POA: Insufficient documentation

## 2021-05-29 HISTORY — DX: Dyspnea, unspecified: R06.00

## 2021-05-29 HISTORY — PX: CATARACT EXTRACTION W/PHACO: SHX586

## 2021-05-29 HISTORY — DX: Unspecified convulsions: R56.9

## 2021-05-29 SURGERY — PHACOEMULSIFICATION, CATARACT, WITH IOL INSERTION
Anesthesia: Monitor Anesthesia Care | Site: Eye | Laterality: Left

## 2021-05-29 MED ORDER — NA HYALUR & NA CHOND-NA HYALUR 0.4-0.35 ML IO KIT
PACK | INTRAOCULAR | Status: DC | PRN
  Administered 2021-05-29: 1 mL via INTRAOCULAR

## 2021-05-29 MED ORDER — FENTANYL CITRATE (PF) 100 MCG/2ML IJ SOLN
INTRAMUSCULAR | Status: DC | PRN
  Administered 2021-05-29 (×2): 50 ug via INTRAVENOUS

## 2021-05-29 MED ORDER — LACTATED RINGERS IV SOLN: INTRAVENOUS | Status: DC

## 2021-05-29 MED ORDER — CEFUROXIME OPHTHALMIC INJECTION 1 MG/0.1 ML
INJECTION | OPHTHALMIC | Status: DC | PRN
  Administered 2021-05-29: 0.1 mL via INTRACAMERAL

## 2021-05-29 MED ORDER — BRIMONIDINE TARTRATE-TIMOLOL 0.2-0.5 % OP SOLN
OPHTHALMIC | Status: DC | PRN
  Administered 2021-05-29: 1 [drp] via OPHTHALMIC

## 2021-05-29 MED ORDER — MIDAZOLAM HCL 2 MG/2ML IJ SOLN
INTRAMUSCULAR | Status: DC | PRN
  Administered 2021-05-29: 2 mg via INTRAVENOUS

## 2021-05-29 MED ORDER — TETRACAINE HCL 0.5 % OP SOLN
1.0000 [drp] | OPHTHALMIC | Status: DC | PRN
  Administered 2021-05-29 (×3): 1 [drp] via OPHTHALMIC

## 2021-05-29 MED ORDER — ARMC OPHTHALMIC DILATING DROPS
1.0000 "application " | OPHTHALMIC | Status: DC | PRN
  Administered 2021-05-29 (×3): 1 via OPHTHALMIC

## 2021-05-29 MED ORDER — ACETAMINOPHEN 325 MG PO TABS: 325.0000 mg | ORAL_TABLET | ORAL | Status: DC | PRN

## 2021-05-29 MED ORDER — LIDOCAINE HCL (PF) 2 % IJ SOLN
INTRAOCULAR | Status: DC | PRN
  Administered 2021-05-29: 2 mL

## 2021-05-29 MED ORDER — ACETAMINOPHEN 160 MG/5ML PO SOLN: 325.0000 mg | ORAL | Status: DC | PRN

## 2021-05-29 MED ORDER — EPINEPHRINE PF 1 MG/ML IJ SOLN
INTRAOCULAR | Status: DC | PRN
  Administered 2021-05-29: 62 mL via OPHTHALMIC

## 2021-05-29 SURGICAL SUPPLY — 25 items
CANNULA ANT/CHMB 27GA (MISCELLANEOUS) ×3 IMPLANT
GLOVE SURG ENC TEXT LTX SZ7.5 (GLOVE) ×6 IMPLANT
GLOVE SURG TRIUMPH 8.0 PF LTX (GLOVE) ×3 IMPLANT
GOWN STRL REUS W/ TWL LRG LVL3 (GOWN DISPOSABLE) ×2 IMPLANT
GOWN STRL REUS W/TWL LRG LVL3 (GOWN DISPOSABLE) ×6
LENS IOL ACRSF IQ ULTRA 23.0 (Intraocular Lens) ×1 IMPLANT
LENS IOL ACRYSOF IQ 23.0 (Intraocular Lens) ×3 IMPLANT
MARKER SKIN DUAL TIP RULER LAB (MISCELLANEOUS) ×3 IMPLANT
NDL RETROBULBAR .5 NSTRL (NEEDLE) IMPLANT
NEEDLE CAPSULORHEX 25GA (NEEDLE) ×3 IMPLANT
NEEDLE FILTER BLUNT 18X 1/2SAF (NEEDLE) ×4
NEEDLE FILTER BLUNT 18X1 1/2 (NEEDLE) ×2 IMPLANT
PACK CATARACT BRASINGTON (MISCELLANEOUS) ×3 IMPLANT
PACK EYE AFTER SURG (MISCELLANEOUS) ×3 IMPLANT
PACK OPTHALMIC (MISCELLANEOUS) ×3 IMPLANT
RING MALYGIN 7.0 (MISCELLANEOUS) IMPLANT
SOLUTION OPHTHALMIC SALT (MISCELLANEOUS) ×3 IMPLANT
SUT ETHILON 10-0 CS-B-6CS-B-6 (SUTURE)
SUT VICRYL  9 0 (SUTURE)
SUT VICRYL 9 0 (SUTURE) IMPLANT
SUTURE EHLN 10-0 CS-B-6CS-B-6 (SUTURE) IMPLANT
SYR 3ML LL SCALE MARK (SYRINGE) ×6 IMPLANT
SYR TB 1ML LUER SLIP (SYRINGE) ×3 IMPLANT
WATER STERILE IRR 250ML POUR (IV SOLUTION) ×3 IMPLANT
WIPE NON LINTING 3.25X3.25 (MISCELLANEOUS) ×3 IMPLANT

## 2021-05-29 NOTE — Anesthesia Postprocedure Evaluation (Signed)
Anesthesia Post Note  Patient: Andre Rios  Procedure(s) Performed: CATARACT EXTRACTION PHACO AND INTRAOCULAR LENS PLACEMENT (IOC) LEFT 7.78 01:01.9 (Left: Eye)     Patient location during evaluation: PACU Anesthesia Type: MAC Level of consciousness: awake and alert Pain management: pain level controlled Vital Signs Assessment: post-procedure vital signs reviewed and stable Respiratory status: spontaneous breathing, nonlabored ventilation, respiratory function stable and patient connected to nasal cannula oxygen Cardiovascular status: stable and blood pressure returned to baseline Postop Assessment: no apparent nausea or vomiting Anesthetic complications: no   No notable events documented.  Trecia Rogers

## 2021-05-29 NOTE — Op Note (Signed)
  OPERATIVE NOTE  Andre Rios [id number] 05/29/2021   PREOPERATIVE DIAGNOSIS:  Nuclear sclerotic cataract left eye. H25.12   POSTOPERATIVE DIAGNOSIS:    Nuclear sclerotic cataract left eye.     PROCEDURE:  Phacoemusification with posterior chamber intraocular lens placement of the left eye  Ultrasound time: Procedure(s): CATARACT EXTRACTION PHACO AND INTRAOCULAR LENS PLACEMENT (IOC) LEFT 7.78 01:01.9 (Left)  LENS:   Implant Name Type Inv. Item Serial No. Manufacturer Lot No. LRB No. Used Action  LENS IOL ACRYSOF IQ 23.0 - [id number] Intraocular Lens LENS IOL ACRYSOF IQ 23.0 [id number] [employer]  Left 1 Implanted      SURGEON:  Wyonia Hough, MD   ANESTHESIA:  Topical with tetracaine drops and 2% Xylocaine jelly, augmented with 1% preservative-free intracameral lidocaine.    COMPLICATIONS:  None.   DESCRIPTION OF PROCEDURE:  The patient was identified in the holding room and transported to the operating room and placed in the supine position under the operating microscope.  The left eye was identified as the operative eye and it was prepped and draped in the usual sterile ophthalmic fashion.   A 1 millimeter clear-corneal paracentesis was made at the 1:30 position.  0.5 ml of preservative-free 1% lidocaine was injected into the anterior chamber.  The anterior chamber was filled with Viscoat viscoelastic.  A 2.4 millimeter keratome was used to make a near-clear corneal incision at the 10:30 position.  .  A curvilinear capsulorrhexis was made with a cystotome and capsulorrhexis forceps.  Balanced salt solution was used to hydrodissect and hydrodelineate the nucleus.   Phacoemulsification was then used in stop and chop fashion to remove the lens nucleus and epinucleus.  The remaining cortex was then removed using the irrigation and aspiration handpiece. Provisc was then placed into the capsular bag to distend it for lens placement.  A lens was then injected into the capsular bag.   The remaining viscoelastic was aspirated.   Wounds were hydrated with balanced salt solution.  The anterior chamber was inflated to a physiologic pressure with balanced salt solution.  No wound leaks were noted. Cefuroxime 0.1 ml of a 10mg /ml solution was injected into the anterior chamber for a dose of 1 mg of intracameral antibiotic at the completion of the case.   Timolol and Brimonidine drops were applied to the eye.  The patient was taken to the recovery room in stable condition without complications of anesthesia or surgery.  Andre Rios 05/29/2021, 12:42 PM

## 2021-05-29 NOTE — Anesthesia Preprocedure Evaluation (Signed)
Anesthesia Evaluation  Patient identified by MRN, date of birth, ID band Patient awake    Reviewed: Allergy & Precautions, H&P , NPO status , Patient's Chart, lab work & pertinent test results, reviewed documented beta blocker date and time   Airway Mallampati: II  TM Distance: >3 FB Neck ROM: full    Dental no notable dental hx.    Pulmonary shortness of breath, asthma , COPD, Current Smoker,    Pulmonary exam normal breath sounds clear to auscultation       Cardiovascular Exercise Tolerance: Good negative cardio ROS Normal cardiovascular exam Rhythm:regular Rate:Normal     Neuro/Psych Seizures -,  PSYCHIATRIC DISORDERS Schizophrenia Dementia    GI/Hepatic negative GI ROS, Neg liver ROS,   Endo/Other  negative endocrine ROS  Renal/GU negative Renal ROS  negative genitourinary   Musculoskeletal   Abdominal   Peds  Hematology negative hematology ROS (+)   Anesthesia Other Findings   Reproductive/Obstetrics negative OB ROS                             Anesthesia Physical Anesthesia Plan  ASA: 3  Anesthesia Plan: MAC   Post-op Pain Management:    Induction:   PONV Risk Score and Plan:   Airway Management Planned:   Additional Equipment:   Intra-op Plan:   Post-operative Plan:   Informed Consent: I have reviewed the patients History and Physical, chart, labs and discussed the procedure including the risks, benefits and alternatives for the proposed anesthesia with the patient or authorized representative who has indicated his/her understanding and acceptance.     Dental Advisory Given  Plan Discussed with: CRNA and Anesthesiologist  Anesthesia Plan Comments:         Anesthesia Quick Evaluation

## 2021-05-29 NOTE — Transfer of Care (Signed)
Immediate Anesthesia Transfer of Care Note  Patient: Andre Rios  Procedure(s) Performed: CATARACT EXTRACTION PHACO AND INTRAOCULAR LENS PLACEMENT (IOC) LEFT 7.78 01:01.9 (Left: Eye)  Patient Location: PACU  Anesthesia Type: MAC  Level of Consciousness: awake, alert  and patient cooperative  Airway and Oxygen Therapy: Patient Spontanous Breathing and Patient connected to supplemental oxygen  Post-op Assessment: Post-op Vital signs reviewed, Patient's Cardiovascular Status Stable, Respiratory Function Stable, Patent Airway and No signs of Nausea or vomiting  Post-op Vital Signs: Reviewed and stable  Complications: No notable events documented.

## 2021-05-29 NOTE — H&P (Signed)
[facility]   Primary Care Physician:  [facility] [place] Ophthalmologist: Dr. Leandrew Koyanagi  Pre-Procedure History & Physical: HPI:  Andre Rios is a 70 y.o. male here for ophthalmic surgery.   Past Medical History:  Diagnosis Date   Alcohol abuse    Asthma    COPD (chronic obstructive pulmonary disease) (HCC)    Dyspnea    Pneumonia    Seasonal allergies    Seizure ([place])     Past Surgical History:  Procedure Laterality Date   EYE SURGERY      Prior to Admission medications   Medication Sig Start Date End Date Taking? Authorizing Provider  acetaminophen (TYLENOL) 500 MG tablet Take 500 mg by mouth every 6 (six) hours as needed for mild pain, fever or headache.    Yes [provider]  albuterol (PROVENTIL HFA;VENTOLIN HFA) 108 (90 Base) MCG/ACT inhaler Inhale 2 puffs into the lungs every 4 (four) hours as needed for wheezing or shortness of breath. 12/18/17  Yes Merlyn Lot, MD  ibuprofen (ADVIL,MOTRIN) 200 MG tablet Take 200 mg by mouth every 6 (six) hours as needed for fever, headache or mild pain.    Yes [provider]  Phenyleph-Doxylamine-DM-APAP (ALKA SELTZER PLUS PO) Take 1 tablet by mouth daily as needed (cold or flu symptoms).    Yes [provider]  azithromycin (ZITHROMAX) 500 MG tablet Take 1 tablet (500 mg total) by mouth daily. X 4 more days Patient not taking: Reported on 05/20/2021 08/11/17   Gladstone Lighter, MD  cefUROXime (CEFTIN) 500 MG tablet Take 1 tablet (500 mg total) by mouth 2 (two) times daily with a meal. X 7 days Patient not taking: Reported on 05/20/2021 08/10/17   Gladstone Lighter, MD  fluticasone [facility]) 50 MCG/ACT nasal spray Place 1 spray into both nostrils daily. 08/12/18 08/12/19  Hillary Bow, MD    Allergies as of 04/03/2021   (No Known Allergies)    History reviewed. No pertinent family history.  Social History   Socioeconomic History   Marital status: Legally  Separated    Spouse name: Not on file   Number of children: Not on file   Years of education: Not on file   Highest education level: Not on file  Occupational History   Not on file  Tobacco Use   Smoking status: Every Day    Packs/day: 1.00    Years: 48.00    Pack years: 48.00    Types: Cigarettes   Smokeless tobacco: Never  Substance and Sexual Activity   Alcohol use: Not Currently    Comment: history of ETOH abuse, quit 5 yrs ago   Drug use: No   Sexual activity: Not on file  Other Topics Concern   Not on file  Social History Narrative   Not on file   Social Determinants of Health   Financial Resource Strain: Not on file  Food Insecurity: Not on file  Transportation Needs: Not on file  Physical Activity: Not on file  Stress: Not on file  Social Connections: Not on file  Intimate Partner Violence: Not on file    Review of Systems: See HPI, otherwise negative ROS  Physical Exam: BP 124/78   Pulse 72   Temp (!) 97.5 F (36.4 C) (Temporal)   Resp 20   Ht 5\' 6"  (1.676 m)   Wt 59.7 kg   SpO2 97%   BMI 21.24 kg/m  General:   Alert,  pleasant and cooperative in NAD Head:  Normocephalic and atraumatic. Lungs:  Clear to auscultation.    Heart:  Regular rate and rhythm.   Impression/Plan: Andre Rios is here for ophthalmic surgery.  Risks, benefits, limitations, and alternatives regarding ophthalmic surgery have been reviewed with the patient.  Questions have been answered.  All parties agreeable.   Leandrew Koyanagi, MD  05/29/2021, 11:45 AM

## 2021-05-29 NOTE — Anesthesia Procedure Notes (Signed)
Procedure Name: MAC Date/Time: 05/29/2021 12:22 PM Performed by: Jeannene Patella, CRNA Pre-anesthesia Checklist: Patient identified, Emergency Drugs available, Suction available, Timeout performed and Patient being monitored Patient Re-evaluated:Patient Re-evaluated prior to induction Oxygen Delivery Method: Nasal cannula Placement Confirmation: positive ETCO2

## 2021-05-30 ENCOUNTER — Encounter: Payer: Self-pay | Admitting: Ophthalmology

## 2022-12-18 ENCOUNTER — Encounter: Payer: Self-pay | Admitting: Urology

## 2022-12-18 ENCOUNTER — Ambulatory Visit (INDEPENDENT_AMBULATORY_CARE_PROVIDER_SITE_OTHER): Payer: Medicare HMO | Admitting: Urology

## 2022-12-18 VITALS — BP 151/78 | HR 80 | Ht 66.0 in | Wt 127.0 lb

## 2022-12-18 DIAGNOSIS — R972 Elevated prostate specific antigen [PSA]: Secondary | ICD-10-CM

## 2022-12-18 DIAGNOSIS — R351 Nocturia: Secondary | ICD-10-CM

## 2022-12-18 DIAGNOSIS — R3912 Poor urinary stream: Secondary | ICD-10-CM

## 2022-12-18 DIAGNOSIS — R399 Unspecified symptoms and signs involving the genitourinary system: Secondary | ICD-10-CM

## 2022-12-18 LAB — BLADDER SCAN AMB NON-IMAGING

## 2022-12-18 MED ORDER — TAMSULOSIN HCL 0.4 MG PO CAPS: 0.4000 mg | ORAL_CAPSULE | Freq: Every day | ORAL | 11 refills | Status: DC

## 2022-12-18 NOTE — Progress Notes (Signed)
12/18/22 11:01 AM   Andre Rios Dec 03, 1951 [id number]  CC: Elevated PSA, urinary symptoms  HPI: 72 year old male with history of alcohol abuse and COPD who was referred for urinary symptoms as well as an elevated PSA of 15.  He reports some worsening weak stream in the morning as well as nocturia 2-3 times overnight.  He denies any gross hematuria or dysuria.  Dipstick urinalysis with PCP mid December was benign, no PSA was elevated at 15.  No prior PSA values to review.  No prior cross-sectional imaging to review.  He reports about 15 pounds of weight loss over the last few years.  PVR is normal today at 25m.  He has a brother with prostate cancer that was treated with radiation in [place]   PMH: Past Medical History:  Diagnosis Date   Alcohol abuse    Asthma    COPD (chronic obstructive pulmonary disease) (HCC)    Dyspnea    Pneumonia    Seasonal allergies    Seizure ([facility]     Surgical History: Past Surgical History:  Procedure Laterality Date   CATARACT EXTRACTION W/PHACO Left 05/29/2021   Procedure: CATARACT EXTRACTION PHACO AND INTRAOCULAR LENS PLACEMENT (IOC) LEFT 7.78 01:01.9;  Surgeon: BLeandrew Koyanagi MD;  [place] [facility]  Service: Ophthalmology;  Laterality: Left;   EYE SURGERY        Family History: No family history on file.  Social History:  reports that he has been smoking cigarettes. He has a 48.00 pack-year smoking history. He has never used smokeless tobacco. He reports that he does not currently use alcohol. He reports that he does not use drugs.  Physical Exam: BP (!) 151/70 (BP Location: Left Arm, Patient Position: Sitting, Cuff Size: Normal)   Ht '5\' 6"'$  (1.676 m)   Wt 127 lb (57.6 kg)   BMI 20.50 kg/m    Constitutional:  Alert and oriented, No acute distress. Cardiovascular: No clubbing, cyanosis, or edema. Respiratory: Normal respiratory effort, no increased work of breathing. GI: Abdomen is soft, nontender,  nondistended, no abdominal masses GU:  CVA tenderness, phallus without lesions,widely patent meatus DRE: 60 g, smooth, no nodules or masses  Laboratory Data: See HPI  Pertinent Imaging: None to review  Assessment & Plan:   72year old male with a isolated elevated PSA of 15 as well as worsening urinary symptoms with weak stream and nocturia.  Has a family history of prostate cancer in his brother treated with radiation.  We reviewed the implications of an elevated PSA and the uncertainty surrounding it. In general, a man's PSA increases with age and is produced by both normal and cancerous prostate tissue. The differential diagnosis for elevated PSA includes BPH, prostate cancer, infection, recent intercourse/ejaculation, recent urethroscopic manipulation (foley placement/cystoscopy) or trauma, and prostatitis.   Management of an elevated PSA can include observation or prostate biopsy and we discussed this in detail. Our goal is to detect clinically significant prostate cancers, and manage with either active surveillance, surgery, or radiation for localized disease. Risks of prostate biopsy include bleeding, infection (including life threatening sepsis), pain, and lower urinary symptoms. Hematuria, hematospermia, and blood in the stool are all common after biopsy and can persist up to 4 weeks.   Repeat PSA with reflex to free today, will schedule prostate biopsy if remains elevated Trial of Flomax 0.4 mg nightly, risk and benefits discussed  BNickolas Madrid MD 12/18/2022  [place] [place] [phone]

## 2022-12-18 NOTE — Patient Instructions (Signed)
Prostate Cancer Screening  Prostate cancer screening is testing that is done to check for the presence of prostate cancer in men. The prostate gland is a walnut-sized gland that is located below the bladder and in front of the rectum in males. The function of the prostate is to add fluid to semen during ejaculation. Prostate cancer is one of the most common types of cancer in men. Who should have prostate cancer screening? Screening recommendations vary based on age and other risk factors, as well as between the professional organizations who make the recommendations. In general, screening is recommended if: You are age 50 to 70 and have an average risk for prostate cancer. You should talk with your health care provider about your need for screening and how often screening should be done. Because most prostate cancers are slow growing and will not cause death, screening in this age group is generally reserved for men who have a 10- to 15-year life expectancy. You are younger than age 50, and you have these risk factors: Having a father, brother, or uncle who has been diagnosed with prostate cancer. The risk is higher if your family member's cancer occurred at an early age or if you have multiple family members with prostate cancer at an early age. Being a male who is Black or is of Caribbean or sub-Saharan African descent. In general, screening is not recommended if: You are younger than age 40. You are between the ages of 40 and 49 and you have no risk factors. You are 70 years of age or older. At this age, the risks that screening can cause are greater than the benefits that it may provide. If you are at high risk for prostate cancer, your health care provider may recommend that you have screenings more often or that you start screening at a younger age. How is screening for prostate cancer done? The recommended prostate cancer screening test is a blood test called the prostate-specific antigen  (PSA) test. PSA is a protein that is made in the prostate. As you age, your prostate naturally produces more PSA. Abnormally high PSA levels may be caused by: Prostate cancer. An enlarged prostate that is not caused by cancer (benign prostatic hyperplasia, or BPH). This condition is very common in older men. A prostate gland infection (prostatitis) or urinary tract infection. Certain medicines such as male hormones (like testosterone) or other medicines that raise testosterone levels. A rectal exam may be done as part of prostate cancer screening to help provide information about the size of your prostate gland. When a rectal exam is performed, it should be done after the PSA level is drawn to avoid any effect on the results. Depending on the PSA results, you may need more tests, such as: A physical exam to check the size of your prostate gland, if not done as part of screening. Blood and imaging tests. A procedure to remove tissue samples from your prostate gland for testing (biopsy). This is the only way to know for certain if you have prostate cancer. What are the benefits of prostate cancer screening? Screening can help to identify cancer at an early stage, before symptoms start and when the cancer can be treated more easily. There is a small chance that screening may lower your risk of dying from prostate cancer. The chance is small because prostate cancer is a slow-growing cancer, and most men with prostate cancer die from a different cause. What are the risks of prostate cancer screening? The   main risk of prostate cancer screening is diagnosing and treating prostate cancer that would never have caused any symptoms or problems. This is called overdiagnosisand overtreatment. PSA screening cannot tell you if your PSA is high due to cancer or a different cause. A prostate biopsy is the only procedure to diagnose prostate cancer. Even the results of a biopsy may not tell you if your cancer needs to  be treated. Slow-growing prostate cancer may not need any treatment other than monitoring, so diagnosing and treating it may cause unnecessary stress or other side effects. Questions to ask your health care provider When should I start prostate cancer screening? What is my risk for prostate cancer? How often do I need screening? What type of screening tests do I need? How do I get my test results? What do my results mean? Do I need treatment? Where to find more information The American Cancer Society: www.cancer.org American Urological Association: www.auanet.org Contact a health care provider if: You have difficulty urinating. You have pain when you urinate or ejaculate. You have blood in your urine or semen. You have pain in your back or in the area of your prostate. Summary Prostate cancer is a common type of cancer in men. The prostate gland is located below the bladder and in front of the rectum. This gland adds fluid to semen during ejaculation. Prostate cancer screening may identify cancer at an early stage, when the cancer can be treated more easily and is less likely to have spread to other areas of the body. The prostate-specific antigen (PSA) test is the recommended screening test for prostate cancer, but it has associated risks. Discuss the risks and benefits of prostate cancer screening with your health care provider. If you are age 71 or older, the risks that screening can cause are greater than the benefits that it may provide. This information is not intended to replace advice given to you by your health care provider. Make sure you discuss any questions you have with your health care provider. Document Revised: 05/27/2021 Document Reviewed: 05/27/2021 Elsevier Patient Education  [place].  Transrectal Ultrasound-Guided Prostate Biopsy An ultrasound is a test that uses sound waves to take pictures of the inside of the body. During this test, a small device (probe)  with a gel is put inside your butt (rectum). The probe will take pictures of your prostate to help guide the needle. A needle will be inserted to take samples of tissues for testing. This test may be done to check for changes in the prostate, like prostate swelling or cancer. Tell your doctor about: Any allergies you have. All medicines you are taking. Any problems you or family members have had with anesthetic medicines. Any bleeding problems you have. Any surgeries you have had. Any medical conditions you have. Any prostate infections you have had. What are the risks? Infection. Bleeding from the butt. Blood in the pee (urine). Allergic reactions to medicines. Damage to nearby parts. Trouble peeing. Nerve damage. This is usually temporary. What happens before the test? Medicines Ask your doctor about changing or stopping: Your normal medicines. Vitamins, herbs, and supplements. Over-the-counter medicines. Do not take aspirin or ibuprofen unless you are told to. General instructions Follow instructions from your doctor about what you cannot eat or drink. Liquid will be used to clear waste from your butt (enema). You may have a blood sample taken. You may have a pee sample taken. For your safety, your doctor may: Ask you to wash with  a soap that kills germs. Give you antibiotic medicine. If you will be going home right after the test, plan to have a responsible adult: Take you home from the hospital or clinic. You will not be allowed to drive. Care for you for the time you are told. What happens during the test?  An IV tube will be put into one of your veins. You will be given one or both of these: A medicine to help you relax. A medicine to numb the area. You will be placed on your left side. Your knees will be bent toward your chest. A probe with gel on it will be placed in your butt. Pictures will be taken of your prostate and the area around it. Medicine will be used  to numb your prostate. A needle will be placed in your butt and moved to your prostate. Prostate tissue will be taken out. The needle and probe will be taken out. The samples will be sent to a lab. The procedure may vary among doctors and hospitals. What happens after the test? You will be watched until you leave the hospital or clinic. This includes checking your blood pressure, heart rate, breathing rate, and blood oxygen level. You may have some pain in your butt. You will be given medicine for it. If you were given a sedative during the test, do not drive or use machines until your doctor says that it is safe. A sedative is a medicine that helps you relax. It is up to you to get the results of your test. Ask how to get your results when they are ready. Summary This test is usually done to check for prostate cancer. Before the test, ask your doctor about changing or stopping your medicines. You may have some pain in your butt. You will be given medicine for it. Plan to have a responsible adult take you home from the hospital or clinic. This information is not intended to replace advice given to you by your health care provider. Make sure you discuss any questions you have with your health care provider. Document Revised: 05/27/2021 Document Reviewed: 05/27/2021 Elsevier Patient Education  [place].

## 2022-12-19 ENCOUNTER — Other Ambulatory Visit: Payer: Self-pay | Admitting: Urology

## 2022-12-19 ENCOUNTER — Telehealth: Payer: Self-pay

## 2022-12-19 DIAGNOSIS — R972 Elevated prostate specific antigen [PSA]: Secondary | ICD-10-CM

## 2022-12-19 LAB — PSA TOTAL (REFLEX TO FREE): Prostate Specific Ag, Serum: 15.3 ng/mL — ABNORMAL HIGH (ref 0.0–4.0)

## 2022-12-19 MED ORDER — FLEET ENEMA 7-19 GM/118ML RE ENEM: 1.0000 | ENEMA | Freq: Once | RECTAL | 0 refills | Status: AC

## 2022-12-19 MED ORDER — DIAZEPAM 5 MG PO TABS: 5.0000 mg | ORAL_TABLET | Freq: Once | ORAL | 0 refills | Status: DC | PRN

## 2022-12-19 NOTE — Telephone Encounter (Signed)
Called pt informed him of the need for prostate biopsy. Pt voiced understanding. Appointment scheduled. Pt given pre biopsy instructions.Please send valium he has obtained a ride.

## 2022-12-19 NOTE — Telephone Encounter (Signed)
-----   Message from Billey Co, MD sent at 12/19/2022  7:26 AM EST ----- PSA remains elevated at 15.  Please review prostate biopsy instructions and schedule prostate biopsy.  I can send in 5 mg of Valium if he has a ride to biopsy  Nickolas Madrid, MD 12/19/2022

## 2022-12-25 ENCOUNTER — Encounter: Payer: Self-pay | Admitting: Urology

## 2022-12-25 ENCOUNTER — Ambulatory Visit (INDEPENDENT_AMBULATORY_CARE_PROVIDER_SITE_OTHER): Payer: Medicare HMO | Admitting: Urology

## 2022-12-25 VITALS — BP 155/80 | HR 78 | Ht 66.0 in | Wt 127.0 lb

## 2022-12-25 DIAGNOSIS — C61 Malignant neoplasm of prostate: Secondary | ICD-10-CM | POA: Diagnosis not present

## 2022-12-25 DIAGNOSIS — R972 Elevated prostate specific antigen [PSA]: Secondary | ICD-10-CM

## 2022-12-25 MED ORDER — GENTAMICIN SULFATE 40 MG/ML IJ SOLN
80.0000 mg | Freq: Once | INTRAMUSCULAR | Status: AC
  Administered 2022-12-25: 80 mg via INTRAMUSCULAR

## 2022-12-25 MED ORDER — LEVOFLOXACIN 500 MG PO TABS
500.0000 mg | ORAL_TABLET | Freq: Once | ORAL | Status: AC
  Administered 2022-12-25: 500 mg via ORAL

## 2022-12-25 NOTE — Progress Notes (Signed)
   12/25/22  Indication: Elevated PSA, 15.3  Prostate Biopsy Procedure   Informed consent was obtained, and we discussed the risks of bleeding and infection/sepsis. A time out was performed to ensure correct patient identity.  Pre-Procedure: - Last PSA Level: 15.3 - Gentamicin and levaquin given for antibiotic prophylaxis - Transrectal Ultrasound performed revealing a 33 gm prostate, PSA density 0.46 - No median lobe - Hypoechoic region right lateral prostate  Procedure: - Prostate block performed using 10 cc 1% lidocaine and biopsies taken from sextant areas, a total of 12 under ultrasound guidance.  Post-Procedure: - Patient tolerated the procedure well - He was counseled to seek immediate medical attention if experiences significant bleeding, fevers, or severe pain - Return in one week to discuss biopsy results  Assessment/ Plan: Will follow up in 1-2 weeks to discuss pathology  Nickolas Madrid, MD 12/25/2022

## 2022-12-25 NOTE — Patient Instructions (Signed)

## 2022-12-26 LAB — SURGICAL PATHOLOGY

## 2023-01-08 ENCOUNTER — Ambulatory Visit (INDEPENDENT_AMBULATORY_CARE_PROVIDER_SITE_OTHER): Payer: Medicare HMO | Admitting: Urology

## 2023-01-08 ENCOUNTER — Encounter: Payer: Self-pay | Admitting: Urology

## 2023-01-08 VITALS — BP 145/70 | HR 98 | Ht 66.0 in | Wt 127.0 lb

## 2023-01-08 DIAGNOSIS — C61 Malignant neoplasm of prostate: Secondary | ICD-10-CM

## 2023-01-08 NOTE — Progress Notes (Signed)
01/08/2023 12:48 PM   Georgian Co Bogard Aug 22, 1951 [id number]  Referring provider: [facility] [place],  [place] [address]  Chief Complaint  Patient presents with   Results    HPI: 72 y.o. male presents for prostate biopsy follow-up.  Dr. Diamantina Providence was called to the OR and I am seeing the patient for him.  Biopsy 12/25/2022 PSA 15.3; 33 g prostate No postbiopsy complaints Pathology: 6/6 cores positive left 3 showing Gleason 3+4 adenocarcinoma (100% tissue involvement)      PMH: Past Medical History:  Diagnosis Date   Alcohol abuse    Asthma    COPD (chronic obstructive pulmonary disease) (HCC)    Dyspnea    Pneumonia    Seasonal allergies    Seizure [facility])     Surgical History: Past Surgical History:  Procedure Laterality Date   CATARACT EXTRACTION W/PHACO Left 05/29/2021   Procedure: CATARACT EXTRACTION PHACO AND INTRAOCULAR LENS PLACEMENT ([place]) LEFT 7.78 01:01.9;  Surgeon: Leandrew Koyanagi, MD;  Location: [facility];  Service: Ophthalmology;  Laterality: Left;   EYE SURGERY      Home Medications:  Allergies as of 01/08/2023   No Known Allergies      Medication List        Accurate as of January 08, 2023 12:48 PM. If you have any questions, ask your nurse or doctor.          acetaminophen 500 MG tablet Commonly known as: TYLENOL Take 500 mg by mouth every 6 (six) hours as needed for mild pain, fever or headache.   ALKA SELTZER PLUS PO Take 1 tablet by mouth daily as needed (cold or flu symptoms).   Cialis 10 MG tablet Generic drug: tadalafil Take 10 mg by mouth daily as needed for erectile dysfunction.   ibuprofen 200 MG tablet Commonly known as: ADVIL Take 200 mg by mouth every 6 (six) hours as needed for fever, headache or mild pain.   tamsulosin 0.4 MG Caps capsule Commonly known as: FLOMAX Take 1 capsule (0.4 mg total) by mouth daily.        Allergies: No Known  Allergies  Family History: No family history on file.  Social History:  reports that he has been smoking cigarettes. He has a 48.00 pack-year smoking history. He has never used smokeless tobacco. He reports that he does not currently use alcohol. He reports that he does not use drugs.   Physical Exam: BP (!) 145/70 (BP Location: Left Arm, Patient Position: Sitting, Cuff Size: Normal)   Pulse 98   Ht '5\' 6"'$  (1.676 m)   Wt 127 lb (57.6 kg)   BMI 20.50 kg/m   Constitutional:  Alert and oriented, No acute distress. Psychiatric: Normal mood and affect.   Assessment & Plan:    1.  T1c prostate cancer Risk stratification: Unfavorable intermediate We had a lengthy conversation today about the patient's new diagnosis of prostate cancer.  We reviewed the risk classifications per the AUA guidelines including very low risk, low risk, intermediate risk, and high risk disease, and the need for additional staging imaging in patients with unfavorable intermediate risk and high risk disease.  I explained that his life expectancy, clinical stage, Gleason score, PSA, and other co-morbidities influence treatment strategies.  We discussed the roles of active surveillance, radiation therapy, surgical therapy with robotic prostatectomy, and hormone therapy with androgen deprivation. PSMA/PET ordered for unfavorable intermediate staging He is interested in pursuing radiation therapy if no evidence  of metastatic disease and radiation oncology referral placed  I spent 30 total minutes on the day of the encounter including pre-visit review of the medical record, face-to-face time with the patient, and post visit ordering of labs/imaging/tests.   Abbie Sons, [place] [address], [place], [place] [address] [phone]

## 2023-01-13 ENCOUNTER — Ambulatory Visit: Payer: Medicare HMO | Admitting: Radiation Oncology

## 2023-01-21 ENCOUNTER — Ambulatory Visit
Admission: RE | Admit: 2023-01-21 | Discharge: 2023-01-21 | Disposition: A | Discharge status: Home / Self Care | Payer: Medicare HMO | Source: Ambulatory Visit | Attending: Urology | Admitting: Urology

## 2023-01-21 DIAGNOSIS — C61 Malignant neoplasm of prostate: Secondary | ICD-10-CM | POA: Diagnosis present

## 2023-01-21 DIAGNOSIS — I700 Atherosclerosis of aorta: Secondary | ICD-10-CM | POA: Insufficient documentation

## 2023-01-21 DIAGNOSIS — J439 Emphysema, unspecified: Secondary | ICD-10-CM | POA: Diagnosis not present

## 2023-01-21 MED ORDER — PIFLIFOLASTAT F 18 (PYLARIFY) INJECTION
9.0000 | Freq: Once | INTRAVENOUS | Status: AC
  Administered 2023-01-21: 9.84 via INTRAVENOUS

## 2023-01-26 ENCOUNTER — Telehealth: Payer: Self-pay | Admitting: Urology

## 2023-01-26 ENCOUNTER — Telehealth: Payer: Self-pay | Admitting: *Deleted

## 2023-01-26 NOTE — Telephone Encounter (Signed)
Notified patient as instructed, patient pleased. Discussed follow-up appointments, patient agrees  

## 2023-01-26 NOTE — Telephone Encounter (Signed)
-----   Message from Abbie Sons, MD sent at 01/23/2023  7:34 AM EST ----- Please let Andre Rios know his PET scan did not show any evidence of prostate cancer spread and that his cancer appears to be confined to the prostate.  He has a radiation oncology appointment 01/28/2023.

## 2023-01-26 NOTE — Telephone Encounter (Signed)
Patient called and said he received call from office regarding CT results. He requested a call back. His number is [phone]

## 2023-01-26 NOTE — Telephone Encounter (Signed)
Note started in error...wrong provider.

## 2023-01-28 ENCOUNTER — Encounter: Payer: Self-pay | Admitting: Radiation Oncology

## 2023-01-28 ENCOUNTER — Ambulatory Visit
Admission: RE | Admit: 2023-01-28 | Discharge: 2023-01-28 | Disposition: A | Discharge status: Home / Self Care | Payer: Medicare HMO | Source: Ambulatory Visit | Attending: Radiation Oncology | Admitting: Radiation Oncology

## 2023-01-28 VITALS — BP 144/89 | HR 79 | Temp 97.4°F | Resp 20 | Ht 66.0 in | Wt 130.0 lb

## 2023-01-28 DIAGNOSIS — C61 Malignant neoplasm of prostate: Secondary | ICD-10-CM

## 2023-01-28 NOTE — Consult Note (Signed)
NEW PATIENT EVALUATION  Name: Andre Rios  MRN: [id number]  Date:   01/28/2023     DOB: Nov 20, 1951   This 72 y.o. 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  REFERRING PHYSICIAN: [facility], [place]: No chief complaint on file.   DIAGNOSIS: There were no encounter diagnoses.   PREVIOUS INVESTIGATIONS:  PSMA PET scan reviewed Pathology report reviewed Clinical notes reviewed  HPI: Is a 72 year old male who presents with an elevated PSA back in December 2015.  He had no prior PSA reports.  He underwent transrectal ultrasound-guided biopsy   which was positive for 8 of 12 cores for adenocarcinoma mostly Gleason 7 (3+4) although there was also Gleason 6 (3+3).  Patient is fairly asymptomatic specifically denies increased lower urinary tract symptoms diarrhea or fatigue.  He did have a PSMA PET scan showing focal tracer accumulation in the left posterior peripheral zone of the prostate consistent with prostate cancer no evidence of metastatic disease or pelvic adenopathy.  He is now referred to radiation oncology for opinion.  PLANNED TREATMENT REGIMEN: Image guided IMRT treatment plus ADT therapy  PAST MEDICAL HISTORY:  has a past medical history of Alcohol abuse, Asthma, COPD (chronic obstructive pulmonary disease) ([place]), Dyspnea, Pneumonia, Seasonal allergies, and Seizure ([place]).    PAST SURGICAL HISTORY:  Past Surgical History:  Procedure Laterality Date   CATARACT EXTRACTION W/PHACO Left 05/29/2021   Procedure: CATARACT EXTRACTION PHACO AND INTRAOCULAR LENS PLACEMENT ([facility]) LEFT 7.78 01:01.9;  Surgeon: Leandrew Koyanagi, MD;  Location: [place];  Service: Ophthalmology;  Laterality: Left;   EYE SURGERY      FAMILY HISTORY: family history is not on file.  SOCIAL HISTORY:  reports that he has been smoking cigarettes. He has  a 48.00 pack-year smoking history. He has never used smokeless tobacco. He reports that he does not currently use alcohol. He reports that he does not use drugs.  ALLERGIES: Patient has no known allergies.  MEDICATIONS:  Current Outpatient Medications  Medication Sig Dispense Refill   acetaminophen (TYLENOL) 500 MG tablet Take 500 mg by mouth every 6 (six) hours as needed for mild pain, fever or headache.      CIALIS 10 MG tablet Take 10 mg by mouth daily as needed for erectile dysfunction.     ibuprofen (ADVIL,MOTRIN) 200 MG tablet Take 200 mg by mouth every 6 (six) hours as needed for fever, headache or mild pain.      Phenyleph-Doxylamine-DM-APAP (ALKA SELTZER PLUS PO) Take 1 tablet by mouth daily as needed (cold or flu symptoms).      tamsulosin (FLOMAX) 0.4 MG CAPS capsule Take 1 capsule (0.4 mg total) by mouth daily. 30 capsule 11   No current facility-administered medications for this encounter.    ECOG PERFORMANCE STATUS:  0 - Asymptomatic  REVIEW OF SYSTEMS: Patient does have a history of EtOH abuse COPD. Patient denies any weight loss, fatigue, weakness, fever, chills or night sweats. Patient denies any loss of vision, blurred vision. Patient denies any ringing  of the ears or hearing loss. No irregular heartbeat. Patient denies heart murmur or history of fainting. Patient denies any chest pain or pain radiating to her upper extremities. Patient denies any shortness of breath, difficulty breathing at night, cough or hemoptysis. Patient denies any swelling in the lower legs. Patient denies any nausea vomiting, vomiting of blood,  or coffee ground material in the vomitus. Patient denies any stomach pain. Patient states has had normal bowel movements no significant constipation or diarrhea. Patient denies any dysuria, hematuria or significant nocturia. Patient denies any problems walking, swelling in the joints or loss of balance. Patient denies any skin changes, loss of hair or loss of  weight. Patient denies any excessive worrying or anxiety or significant depression. Patient denies any problems with insomnia. Patient denies excessive thirst, polyuria, polydipsia. Patient denies any swollen glands, patient denies easy bruising or easy bleeding. Patient denies any recent infections, allergies or URI. Patient "s visual fields have not changed significantly in recent time.   PHYSICAL EXAM: BP (!) 144/89 (BP Location: Right Arm, Patient Position: Sitting, Cuff Size: Normal)   Pulse 79   Temp (!) 97.4 F (36.3 C) (Tympanic)   Resp 20   Ht 5' 6"$  (1.676 m)   Wt 130 lb (59 kg)   BMI 20.98 kg/m  Well-developed well-nourished patient in NAD. HEENT reveals PERLA, EOMI, discs not visualized.  Oral cavity is clear. No oral mucosal lesions are identified. Neck is clear without evidence of cervical or supraclavicular adenopathy. Lungs are clear to A&P. Cardiac examination is essentially unremarkable with regular rate and rhythm without murmur rub or thrill. Abdomen is benign with no organomegaly or masses noted. Motor sensory and DTR levels are equal and symmetric in the upper and lower extremities. Cranial nerves II through XII are grossly intact. Proprioception is intact. No peripheral adenopathy or edema is identified. No motor or sensory levels are noted. Crude visual fields are within normal range.  LABORATORY DATA: Pathology reports reviewed    RADIOLOGY RESULTS: PSMA PET scan reviewed compatible with above-stated findings   IMPRESSION: Stage IIb Gleason 7 (3+4) adenocarcinoma the prostate in 72 year old male presenting with a PSA in the 15 range  PLAN: This time I run the [facility] nomogram showing a 12% chance of lymph node involvement and 70% chance of extracapsular extension.  I believe he would be a good candidate for image guided IMRT radiation therapy to his prostate.  Would not treat his pelvic nodes.  I have asked Dr. Bernardo Heater to place fiducial markers in his  prostate for daily image guided treatment.  I have also asked him to start him on one 93-monthdepot of Eligard to go along with his radiation treatments.  Risks and benefits of radiation including increased lower Neri tract symptoms diarrhea fatigue alteration of blood counts all were reviewed with the patient in detail.  Patient comprehends my treatment plan well.  Once markers are placed will set him up for simulation.  Patient is to call with any concerns.  I would like to take this opportunity to thank you for allowing me to participate in the care of your patient..Noreene Filbert MD

## 2023-02-09 ENCOUNTER — Telehealth: Payer: Self-pay | Admitting: Urology

## 2023-02-09 ENCOUNTER — Telehealth: Payer: Self-pay

## 2023-02-09 NOTE — Telephone Encounter (Signed)
No PA required for Eligard.

## 2023-02-11 ENCOUNTER — Telehealth: Payer: Self-pay | Admitting: *Deleted

## 2023-02-11 NOTE — Telephone Encounter (Signed)
Patient notified of CT Sim appointment.

## 2023-03-16 ENCOUNTER — Encounter: Payer: Self-pay | Admitting: Urology

## 2023-03-16 ENCOUNTER — Ambulatory Visit (INDEPENDENT_AMBULATORY_CARE_PROVIDER_SITE_OTHER): Payer: Medicare HMO | Admitting: Urology

## 2023-03-16 VITALS — BP 128/73 | HR 96 | Ht 66.0 in | Wt 127.0 lb

## 2023-03-16 DIAGNOSIS — C61 Malignant neoplasm of prostate: Secondary | ICD-10-CM

## 2023-03-16 MED ORDER — GENTAMICIN SULFATE 40 MG/ML IJ SOLN
80.0000 mg | Freq: Once | INTRAMUSCULAR | Status: AC
  Administered 2023-03-16: 80 mg via INTRAMUSCULAR

## 2023-03-16 MED ORDER — LEUPROLIDE ACETATE (6 MONTH) 45 MG ~~LOC~~ KIT
45.0000 mg | PACK | Freq: Once | SUBCUTANEOUS | Status: AC
Start: 2023-03-16 — End: 2023-03-16
  Administered 2023-03-16: 45 mg via SUBCUTANEOUS

## 2023-03-16 MED ORDER — LEVOFLOXACIN 500 MG PO TABS
500.0000 mg | ORAL_TABLET | Freq: Once | ORAL | Status: AC
  Administered 2023-03-16: 500 mg via ORAL

## 2023-03-16 NOTE — Progress Notes (Signed)
03/16/23  CC: gold fiducial marker placement  HPI: 72 y.o. male with prostate cancer who presents today for placement of fiducial seed markers in anticipation of his upcoming IMRT with Dr. Baruch Gouty.  Prostate Gold fiducial Marker Placement Procedure   Informed consent was obtained after discussing risks/benefits of the procedure.  A time out was performed to ensure correct patient identity.  Pre-Procedure: - Gentamicin given prophylactically - PO Levaquin 500 mg also given today  Procedure: - Lidocaine jelly was administered per rectum - Rectal ultrasound probe was placed without difficulty and the prostate visualized - Prostatic block performed with 10 mL 1% Xylocaine - 3 fiducial gold seed markers placed, one at right base, one at left base, one at apex of prostate gland under transrectal ultrasound guidance  Post-Procedure: - Patient tolerated the procedure well - He was counseled to seek immediate medical attention if experiences any severe pain, significant bleeding, or fevers - One 6 month Lupron site was also recommended.  We discussed most common side effects of hot flashes, tiredness and fatigue    John Giovanni, MD

## 2023-03-16 NOTE — Progress Notes (Signed)
Eligard SubQ Injection   Due to Prostate Cancer patient is present today for a Eligard Injection.  Medication: Eligard 6 month Dose: 45 mg  Location: right  Lot: [id number] Exp: 08/08/2024  Patient tolerated well, no complications were noted  Performed by: Gaspar Cola  One time  injection . given to patient today along with reminder continue on Vitamin D 800-1000iu and Calcium 1000-1200mg  daily while on Androgen Deprivation Therapy.  PA approval dates:

## 2023-03-18 ENCOUNTER — Ambulatory Visit: Payer: Medicare HMO

## 2023-03-19 ENCOUNTER — Ambulatory Visit: Payer: Medicare HMO

## 2023-03-24 ENCOUNTER — Ambulatory Visit
Admission: RE | Admit: 2023-03-24 | Discharge: 2023-03-24 | Disposition: A | Discharge status: Home / Self Care | Payer: Medicare HMO | Source: Ambulatory Visit | Attending: Radiation Oncology | Admitting: Radiation Oncology

## 2023-03-24 DIAGNOSIS — Z510 Encounter for antineoplastic radiation therapy: Secondary | ICD-10-CM | POA: Insufficient documentation

## 2023-03-24 DIAGNOSIS — C61 Malignant neoplasm of prostate: Secondary | ICD-10-CM | POA: Insufficient documentation

## 2023-03-25 DIAGNOSIS — Z510 Encounter for antineoplastic radiation therapy: Secondary | ICD-10-CM | POA: Diagnosis not present

## 2023-03-27 ENCOUNTER — Other Ambulatory Visit: Payer: Self-pay | Admitting: *Deleted

## 2023-03-27 DIAGNOSIS — C61 Malignant neoplasm of prostate: Secondary | ICD-10-CM

## 2023-03-30 ENCOUNTER — Ambulatory Visit: Payer: Medicare HMO

## 2023-03-31 ENCOUNTER — Ambulatory Visit: Payer: Medicare HMO

## 2023-03-31 ENCOUNTER — Ambulatory Visit: Admission: RE | Admit: 2023-03-31 | Payer: Medicare HMO | Source: Ambulatory Visit

## 2023-04-01 ENCOUNTER — Ambulatory Visit
Admission: RE | Admit: 2023-04-01 | Discharge: 2023-04-01 | Disposition: A | Discharge status: Home / Self Care | Payer: Medicare HMO | Source: Ambulatory Visit | Attending: Radiation Oncology | Admitting: Radiation Oncology

## 2023-04-01 ENCOUNTER — Other Ambulatory Visit: Payer: Self-pay

## 2023-04-01 DIAGNOSIS — Z510 Encounter for antineoplastic radiation therapy: Secondary | ICD-10-CM | POA: Diagnosis not present

## 2023-04-01 LAB — RAD ONC ARIA SESSION SUMMARY
Course Elapsed Days: 0
Plan Fractions Treated to Date: 1
Plan Prescribed Dose Per Fraction: 2 Gy
Plan Total Fractions Prescribed: 40
Plan Total Prescribed Dose: 80 Gy
Reference Point Dosage Given to Date: 2 Gy
Reference Point Session Dosage Given: 2 Gy
Session Number: 1

## 2023-04-02 ENCOUNTER — Other Ambulatory Visit: Payer: Self-pay

## 2023-04-02 ENCOUNTER — Ambulatory Visit
Admission: RE | Admit: 2023-04-02 | Discharge: 2023-04-02 | Disposition: A | Discharge status: Home / Self Care | Payer: Medicare HMO | Source: Ambulatory Visit | Attending: Radiation Oncology | Admitting: Radiation Oncology

## 2023-04-02 DIAGNOSIS — Z510 Encounter for antineoplastic radiation therapy: Secondary | ICD-10-CM | POA: Diagnosis not present

## 2023-04-02 LAB — RAD ONC ARIA SESSION SUMMARY
Course Elapsed Days: 1
Plan Fractions Treated to Date: 2
Plan Prescribed Dose Per Fraction: 2 Gy
Plan Total Fractions Prescribed: 40
Plan Total Prescribed Dose: 80 Gy
Reference Point Dosage Given to Date: 4 Gy
Reference Point Session Dosage Given: 2 Gy
Session Number: 2

## 2023-04-03 ENCOUNTER — Other Ambulatory Visit: Payer: Self-pay

## 2023-04-03 ENCOUNTER — Ambulatory Visit
Admission: RE | Admit: 2023-04-03 | Discharge: 2023-04-03 | Disposition: A | Discharge status: Home / Self Care | Payer: Medicare HMO | Source: Ambulatory Visit | Attending: Radiation Oncology | Admitting: Radiation Oncology

## 2023-04-03 DIAGNOSIS — Z510 Encounter for antineoplastic radiation therapy: Secondary | ICD-10-CM | POA: Diagnosis not present

## 2023-04-03 LAB — RAD ONC ARIA SESSION SUMMARY
Course Elapsed Days: 2
Plan Fractions Treated to Date: 3
Plan Prescribed Dose Per Fraction: 2 Gy
Plan Total Fractions Prescribed: 40
Plan Total Prescribed Dose: 80 Gy
Reference Point Dosage Given to Date: 6 Gy
Reference Point Session Dosage Given: 2 Gy
Session Number: 3

## 2023-04-06 ENCOUNTER — Other Ambulatory Visit: Payer: Self-pay

## 2023-04-06 ENCOUNTER — Ambulatory Visit
Admission: RE | Admit: 2023-04-06 | Discharge: 2023-04-06 | Disposition: A | Discharge status: Home / Self Care | Payer: Medicare HMO | Source: Ambulatory Visit | Attending: Radiation Oncology | Admitting: Radiation Oncology

## 2023-04-06 DIAGNOSIS — Z510 Encounter for antineoplastic radiation therapy: Secondary | ICD-10-CM | POA: Diagnosis not present

## 2023-04-06 LAB — RAD ONC ARIA SESSION SUMMARY
Course Elapsed Days: 5
Plan Fractions Treated to Date: 4
Plan Prescribed Dose Per Fraction: 2 Gy
Plan Total Fractions Prescribed: 40
Plan Total Prescribed Dose: 80 Gy
Reference Point Dosage Given to Date: 8 Gy
Reference Point Session Dosage Given: 2 Gy
Session Number: 4

## 2023-04-07 ENCOUNTER — Ambulatory Visit
Admission: RE | Admit: 2023-04-07 | Discharge: 2023-04-07 | Disposition: A | Discharge status: Home / Self Care | Payer: Medicare HMO | Source: Ambulatory Visit | Attending: Radiation Oncology | Admitting: Radiation Oncology

## 2023-04-07 ENCOUNTER — Other Ambulatory Visit: Payer: Self-pay

## 2023-04-07 DIAGNOSIS — Z510 Encounter for antineoplastic radiation therapy: Secondary | ICD-10-CM | POA: Diagnosis not present

## 2023-04-07 LAB — RAD ONC ARIA SESSION SUMMARY
Course Elapsed Days: 6
Plan Fractions Treated to Date: 5
Plan Prescribed Dose Per Fraction: 2 Gy
Plan Total Fractions Prescribed: 40
Plan Total Prescribed Dose: 80 Gy
Reference Point Dosage Given to Date: 10 Gy
Reference Point Session Dosage Given: 2 Gy
Session Number: 5

## 2023-04-08 ENCOUNTER — Inpatient Hospital Stay: Payer: Medicare HMO | Attending: Radiation Oncology

## 2023-04-08 ENCOUNTER — Other Ambulatory Visit: Payer: Self-pay

## 2023-04-08 ENCOUNTER — Ambulatory Visit
Admission: RE | Admit: 2023-04-08 | Discharge: 2023-04-08 | Disposition: A | Discharge status: Home / Self Care | Payer: Medicare HMO | Source: Ambulatory Visit | Attending: Radiation Oncology | Admitting: Radiation Oncology

## 2023-04-08 DIAGNOSIS — Z510 Encounter for antineoplastic radiation therapy: Secondary | ICD-10-CM | POA: Diagnosis not present

## 2023-04-08 LAB — RAD ONC ARIA SESSION SUMMARY
Course Elapsed Days: 7
Plan Fractions Treated to Date: 6
Plan Prescribed Dose Per Fraction: 2 Gy
Plan Total Fractions Prescribed: 40
Plan Total Prescribed Dose: 80 Gy
Reference Point Dosage Given to Date: 12 Gy
Reference Point Session Dosage Given: 2 Gy
Session Number: 6

## 2023-04-09 ENCOUNTER — Ambulatory Visit
Admission: RE | Admit: 2023-04-09 | Discharge: 2023-04-09 | Disposition: A | Discharge status: Home / Self Care | Payer: Medicare HMO | Source: Ambulatory Visit | Attending: Radiation Oncology | Admitting: Radiation Oncology

## 2023-04-09 ENCOUNTER — Other Ambulatory Visit: Payer: Self-pay

## 2023-04-09 DIAGNOSIS — Z510 Encounter for antineoplastic radiation therapy: Secondary | ICD-10-CM | POA: Diagnosis not present

## 2023-04-09 LAB — RAD ONC ARIA SESSION SUMMARY
Course Elapsed Days: 8
Plan Fractions Treated to Date: 7
Plan Prescribed Dose Per Fraction: 2 Gy
Plan Total Fractions Prescribed: 40
Plan Total Prescribed Dose: 80 Gy
Reference Point Dosage Given to Date: 14 Gy
Reference Point Session Dosage Given: 2 Gy
Session Number: 7

## 2023-04-10 ENCOUNTER — Ambulatory Visit
Admission: RE | Admit: 2023-04-10 | Discharge: 2023-04-10 | Disposition: A | Discharge status: Home / Self Care | Payer: Medicare HMO | Source: Ambulatory Visit | Attending: Radiation Oncology | Admitting: Radiation Oncology

## 2023-04-10 ENCOUNTER — Other Ambulatory Visit: Payer: Self-pay

## 2023-04-10 DIAGNOSIS — Z510 Encounter for antineoplastic radiation therapy: Secondary | ICD-10-CM | POA: Diagnosis not present

## 2023-04-10 LAB — RAD ONC ARIA SESSION SUMMARY
Course Elapsed Days: 9
Plan Fractions Treated to Date: 8
Plan Prescribed Dose Per Fraction: 2 Gy
Plan Total Fractions Prescribed: 40
Plan Total Prescribed Dose: 80 Gy
Reference Point Dosage Given to Date: 16 Gy
Reference Point Session Dosage Given: 2 Gy
Session Number: 8

## 2023-04-13 ENCOUNTER — Ambulatory Visit
Admission: RE | Admit: 2023-04-13 | Discharge: 2023-04-13 | Disposition: A | Discharge status: Home / Self Care | Payer: Medicare HMO | Source: Ambulatory Visit | Attending: Radiation Oncology | Admitting: Radiation Oncology

## 2023-04-13 ENCOUNTER — Other Ambulatory Visit: Payer: Self-pay

## 2023-04-13 DIAGNOSIS — Z510 Encounter for antineoplastic radiation therapy: Secondary | ICD-10-CM | POA: Diagnosis not present

## 2023-04-13 LAB — RAD ONC ARIA SESSION SUMMARY
Course Elapsed Days: 12
Plan Fractions Treated to Date: 9
Plan Prescribed Dose Per Fraction: 2 Gy
Plan Total Fractions Prescribed: 40
Plan Total Prescribed Dose: 80 Gy
Reference Point Dosage Given to Date: 18 Gy
Reference Point Session Dosage Given: 2 Gy
Session Number: 9

## 2023-04-14 ENCOUNTER — Other Ambulatory Visit: Payer: Self-pay

## 2023-04-14 ENCOUNTER — Ambulatory Visit
Admission: RE | Admit: 2023-04-14 | Discharge: 2023-04-14 | Disposition: A | Discharge status: Home / Self Care | Payer: Medicare HMO | Source: Ambulatory Visit | Attending: Radiation Oncology | Admitting: Radiation Oncology

## 2023-04-14 DIAGNOSIS — Z510 Encounter for antineoplastic radiation therapy: Secondary | ICD-10-CM | POA: Diagnosis not present

## 2023-04-14 LAB — RAD ONC ARIA SESSION SUMMARY
Course Elapsed Days: 13
Plan Fractions Treated to Date: 10
Plan Prescribed Dose Per Fraction: 2 Gy
Plan Total Fractions Prescribed: 40
Plan Total Prescribed Dose: 80 Gy
Reference Point Dosage Given to Date: 20 Gy
Reference Point Session Dosage Given: 2 Gy
Session Number: 10

## 2023-04-15 ENCOUNTER — Other Ambulatory Visit: Payer: Self-pay

## 2023-04-15 ENCOUNTER — Ambulatory Visit
Admission: RE | Admit: 2023-04-15 | Discharge: 2023-04-15 | Disposition: A | Discharge status: Home / Self Care | Payer: Medicare HMO | Source: Ambulatory Visit | Attending: Radiation Oncology | Admitting: Radiation Oncology

## 2023-04-15 DIAGNOSIS — C61 Malignant neoplasm of prostate: Secondary | ICD-10-CM | POA: Diagnosis present

## 2023-04-15 DIAGNOSIS — Z510 Encounter for antineoplastic radiation therapy: Secondary | ICD-10-CM | POA: Insufficient documentation

## 2023-04-15 LAB — RAD ONC ARIA SESSION SUMMARY
Course Elapsed Days: 14
Plan Fractions Treated to Date: 11
Plan Prescribed Dose Per Fraction: 2 Gy
Plan Total Fractions Prescribed: 40
Plan Total Prescribed Dose: 80 Gy
Reference Point Dosage Given to Date: 22 Gy
Reference Point Session Dosage Given: 2 Gy
Session Number: 11

## 2023-04-16 ENCOUNTER — Other Ambulatory Visit: Payer: Self-pay

## 2023-04-16 ENCOUNTER — Ambulatory Visit
Admission: RE | Admit: 2023-04-16 | Discharge: 2023-04-16 | Disposition: A | Discharge status: Home / Self Care | Payer: Medicare HMO | Source: Ambulatory Visit | Attending: Radiation Oncology | Admitting: Radiation Oncology

## 2023-04-16 DIAGNOSIS — Z510 Encounter for antineoplastic radiation therapy: Secondary | ICD-10-CM | POA: Diagnosis not present

## 2023-04-16 LAB — RAD ONC ARIA SESSION SUMMARY
Course Elapsed Days: 15
Plan Fractions Treated to Date: 12
Plan Prescribed Dose Per Fraction: 2 Gy
Plan Total Fractions Prescribed: 40
Plan Total Prescribed Dose: 80 Gy
Reference Point Dosage Given to Date: 24 Gy
Reference Point Session Dosage Given: 2 Gy
Session Number: 12

## 2023-04-17 ENCOUNTER — Other Ambulatory Visit: Payer: Self-pay

## 2023-04-17 ENCOUNTER — Ambulatory Visit
Admission: RE | Admit: 2023-04-17 | Discharge: 2023-04-17 | Disposition: A | Discharge status: Home / Self Care | Payer: Medicare HMO | Source: Ambulatory Visit | Attending: Radiation Oncology | Admitting: Radiation Oncology

## 2023-04-17 DIAGNOSIS — Z510 Encounter for antineoplastic radiation therapy: Secondary | ICD-10-CM | POA: Diagnosis not present

## 2023-04-17 LAB — RAD ONC ARIA SESSION SUMMARY
Course Elapsed Days: 16
Plan Fractions Treated to Date: 13
Plan Prescribed Dose Per Fraction: 2 Gy
Plan Total Fractions Prescribed: 40
Plan Total Prescribed Dose: 80 Gy
Reference Point Dosage Given to Date: 26 Gy
Reference Point Session Dosage Given: 2 Gy
Session Number: 13

## 2023-04-20 ENCOUNTER — Ambulatory Visit
Admission: RE | Admit: 2023-04-20 | Discharge: 2023-04-20 | Disposition: A | Discharge status: Home / Self Care | Payer: Medicare HMO | Source: Ambulatory Visit | Attending: Radiation Oncology | Admitting: Radiation Oncology

## 2023-04-20 ENCOUNTER — Other Ambulatory Visit: Payer: Self-pay

## 2023-04-20 DIAGNOSIS — Z510 Encounter for antineoplastic radiation therapy: Secondary | ICD-10-CM | POA: Diagnosis not present

## 2023-04-20 LAB — RAD ONC ARIA SESSION SUMMARY
Course Elapsed Days: 19
Plan Fractions Treated to Date: 14
Plan Prescribed Dose Per Fraction: 2 Gy
Plan Total Fractions Prescribed: 40
Plan Total Prescribed Dose: 80 Gy
Reference Point Dosage Given to Date: 28 Gy
Reference Point Session Dosage Given: 2 Gy
Session Number: 14

## 2023-04-21 ENCOUNTER — Ambulatory Visit
Admission: RE | Admit: 2023-04-21 | Discharge: 2023-04-21 | Disposition: A | Discharge status: Home / Self Care | Payer: Medicare HMO | Source: Ambulatory Visit | Attending: Radiation Oncology | Admitting: Radiation Oncology

## 2023-04-21 ENCOUNTER — Other Ambulatory Visit: Payer: Self-pay

## 2023-04-21 DIAGNOSIS — Z510 Encounter for antineoplastic radiation therapy: Secondary | ICD-10-CM | POA: Diagnosis not present

## 2023-04-21 LAB — RAD ONC ARIA SESSION SUMMARY
Course Elapsed Days: 20
Plan Fractions Treated to Date: 15
Plan Prescribed Dose Per Fraction: 2 Gy
Plan Total Fractions Prescribed: 40
Plan Total Prescribed Dose: 80 Gy
Reference Point Dosage Given to Date: 30 Gy
Reference Point Session Dosage Given: 2 Gy
Session Number: 15

## 2023-04-22 ENCOUNTER — Inpatient Hospital Stay: Payer: Medicare HMO

## 2023-04-22 ENCOUNTER — Other Ambulatory Visit: Payer: Self-pay

## 2023-04-22 ENCOUNTER — Ambulatory Visit
Admission: RE | Admit: 2023-04-22 | Discharge: 2023-04-22 | Disposition: A | Discharge status: Home / Self Care | Payer: Medicare HMO | Source: Ambulatory Visit | Attending: Radiation Oncology | Admitting: Radiation Oncology

## 2023-04-22 DIAGNOSIS — C61 Malignant neoplasm of prostate: Secondary | ICD-10-CM | POA: Insufficient documentation

## 2023-04-22 DIAGNOSIS — Z510 Encounter for antineoplastic radiation therapy: Secondary | ICD-10-CM | POA: Diagnosis not present

## 2023-04-22 LAB — RAD ONC ARIA SESSION SUMMARY
Course Elapsed Days: 21
Plan Fractions Treated to Date: 16
Plan Prescribed Dose Per Fraction: 2 Gy
Plan Total Fractions Prescribed: 40
Plan Total Prescribed Dose: 80 Gy
Reference Point Dosage Given to Date: 32 Gy
Reference Point Session Dosage Given: 2 Gy
Session Number: 16

## 2023-04-22 LAB — CBC (CANCER CENTER ONLY)
HCT: 44.1 % (ref 39.0–52.0)
Hemoglobin: 13.6 g/dL (ref 13.0–17.0)
MCH: 26.7 pg (ref 26.0–34.0)
MCHC: 30.8 g/dL (ref 30.0–36.0)
MCV: 86.5 fL (ref 80.0–100.0)
Platelet Count: 267 10*3/uL (ref 150–400)
RBC: 5.1 MIL/uL (ref 4.22–5.81)
RDW: 13.9 % (ref 11.5–15.5)
WBC Count: 6.2 10*3/uL (ref 4.0–10.5)
nRBC: 0 % (ref 0.0–0.2)

## 2023-04-23 ENCOUNTER — Ambulatory Visit
Admission: RE | Admit: 2023-04-23 | Discharge: 2023-04-23 | Disposition: A | Discharge status: Home / Self Care | Payer: Medicare HMO | Source: Ambulatory Visit | Attending: Radiation Oncology | Admitting: Radiation Oncology

## 2023-04-23 ENCOUNTER — Other Ambulatory Visit: Payer: Self-pay

## 2023-04-23 DIAGNOSIS — Z510 Encounter for antineoplastic radiation therapy: Secondary | ICD-10-CM | POA: Diagnosis not present

## 2023-04-23 LAB — RAD ONC ARIA SESSION SUMMARY
Course Elapsed Days: 22
Plan Fractions Treated to Date: 17
Plan Prescribed Dose Per Fraction: 2 Gy
Plan Total Fractions Prescribed: 40
Plan Total Prescribed Dose: 80 Gy
Reference Point Dosage Given to Date: 34 Gy
Reference Point Session Dosage Given: 2 Gy
Session Number: 17

## 2023-04-24 ENCOUNTER — Other Ambulatory Visit: Payer: Self-pay

## 2023-04-24 ENCOUNTER — Ambulatory Visit
Admission: RE | Admit: 2023-04-24 | Discharge: 2023-04-24 | Disposition: A | Discharge status: Home / Self Care | Payer: Medicare HMO | Source: Ambulatory Visit | Attending: Radiation Oncology | Admitting: Radiation Oncology

## 2023-04-24 DIAGNOSIS — Z510 Encounter for antineoplastic radiation therapy: Secondary | ICD-10-CM | POA: Diagnosis not present

## 2023-04-24 LAB — RAD ONC ARIA SESSION SUMMARY
Course Elapsed Days: 23
Plan Fractions Treated to Date: 18
Plan Prescribed Dose Per Fraction: 2 Gy
Plan Total Fractions Prescribed: 40
Plan Total Prescribed Dose: 80 Gy
Reference Point Dosage Given to Date: 36 Gy
Reference Point Session Dosage Given: 2 Gy
Session Number: 18

## 2023-04-27 ENCOUNTER — Other Ambulatory Visit: Payer: Self-pay

## 2023-04-27 ENCOUNTER — Ambulatory Visit
Admission: RE | Admit: 2023-04-27 | Discharge: 2023-04-27 | Disposition: A | Discharge status: Home / Self Care | Payer: Medicare HMO | Source: Ambulatory Visit | Attending: Radiation Oncology | Admitting: Radiation Oncology

## 2023-04-27 DIAGNOSIS — Z510 Encounter for antineoplastic radiation therapy: Secondary | ICD-10-CM | POA: Diagnosis not present

## 2023-04-27 LAB — RAD ONC ARIA SESSION SUMMARY
Course Elapsed Days: 26
Plan Fractions Treated to Date: 19
Plan Prescribed Dose Per Fraction: 2 Gy
Plan Total Fractions Prescribed: 40
Plan Total Prescribed Dose: 80 Gy
Reference Point Dosage Given to Date: 38 Gy
Reference Point Session Dosage Given: 2 Gy
Session Number: 19

## 2023-04-28 ENCOUNTER — Ambulatory Visit
Admission: RE | Admit: 2023-04-28 | Discharge: 2023-04-28 | Disposition: A | Discharge status: Home / Self Care | Payer: Medicare HMO | Source: Ambulatory Visit | Attending: Radiation Oncology | Admitting: Radiation Oncology

## 2023-04-28 ENCOUNTER — Other Ambulatory Visit: Payer: Self-pay

## 2023-04-28 DIAGNOSIS — Z510 Encounter for antineoplastic radiation therapy: Secondary | ICD-10-CM | POA: Diagnosis not present

## 2023-04-28 LAB — RAD ONC ARIA SESSION SUMMARY
Course Elapsed Days: 27
Plan Fractions Treated to Date: 20
Plan Prescribed Dose Per Fraction: 2 Gy
Plan Total Fractions Prescribed: 40
Plan Total Prescribed Dose: 80 Gy
Reference Point Dosage Given to Date: 40 Gy
Reference Point Session Dosage Given: 2 Gy
Session Number: 20

## 2023-04-29 ENCOUNTER — Ambulatory Visit
Admission: RE | Admit: 2023-04-29 | Discharge: 2023-04-29 | Disposition: A | Discharge status: Home / Self Care | Payer: Medicare HMO | Source: Ambulatory Visit | Attending: Radiation Oncology | Admitting: Radiation Oncology

## 2023-04-29 ENCOUNTER — Other Ambulatory Visit: Payer: Self-pay

## 2023-04-29 DIAGNOSIS — Z510 Encounter for antineoplastic radiation therapy: Secondary | ICD-10-CM | POA: Diagnosis not present

## 2023-04-29 LAB — RAD ONC ARIA SESSION SUMMARY
Course Elapsed Days: 28
Plan Fractions Treated to Date: 21
Plan Prescribed Dose Per Fraction: 2 Gy
Plan Total Fractions Prescribed: 40
Plan Total Prescribed Dose: 80 Gy
Reference Point Dosage Given to Date: 42 Gy
Reference Point Session Dosage Given: 2 Gy
Session Number: 21

## 2023-04-30 ENCOUNTER — Other Ambulatory Visit: Payer: Self-pay

## 2023-04-30 ENCOUNTER — Ambulatory Visit
Admission: RE | Admit: 2023-04-30 | Discharge: 2023-04-30 | Disposition: A | Discharge status: Home / Self Care | Payer: Medicare HMO | Source: Ambulatory Visit | Attending: Radiation Oncology | Admitting: Radiation Oncology

## 2023-04-30 DIAGNOSIS — Z510 Encounter for antineoplastic radiation therapy: Secondary | ICD-10-CM | POA: Diagnosis not present

## 2023-04-30 LAB — RAD ONC ARIA SESSION SUMMARY
Course Elapsed Days: 29
Plan Fractions Treated to Date: 22
Plan Prescribed Dose Per Fraction: 2 Gy
Plan Total Fractions Prescribed: 40
Plan Total Prescribed Dose: 80 Gy
Reference Point Dosage Given to Date: 44 Gy
Reference Point Session Dosage Given: 2 Gy
Session Number: 22

## 2023-05-01 ENCOUNTER — Other Ambulatory Visit: Payer: Self-pay

## 2023-05-01 ENCOUNTER — Ambulatory Visit
Admission: RE | Admit: 2023-05-01 | Discharge: 2023-05-01 | Disposition: A | Discharge status: Home / Self Care | Payer: Medicare HMO | Source: Ambulatory Visit | Attending: Radiation Oncology | Admitting: Radiation Oncology

## 2023-05-01 DIAGNOSIS — Z510 Encounter for antineoplastic radiation therapy: Secondary | ICD-10-CM | POA: Diagnosis not present

## 2023-05-01 LAB — RAD ONC ARIA SESSION SUMMARY
Course Elapsed Days: 30
Plan Fractions Treated to Date: 23
Plan Prescribed Dose Per Fraction: 2 Gy
Plan Total Fractions Prescribed: 40
Plan Total Prescribed Dose: 80 Gy
Reference Point Dosage Given to Date: 46 Gy
Reference Point Session Dosage Given: 2 Gy
Session Number: 23

## 2023-05-04 ENCOUNTER — Other Ambulatory Visit: Payer: Self-pay

## 2023-05-04 ENCOUNTER — Ambulatory Visit
Admission: RE | Admit: 2023-05-04 | Discharge: 2023-05-04 | Disposition: A | Discharge status: Home / Self Care | Payer: Medicare HMO | Source: Ambulatory Visit | Attending: Radiation Oncology | Admitting: Radiation Oncology

## 2023-05-04 DIAGNOSIS — Z510 Encounter for antineoplastic radiation therapy: Secondary | ICD-10-CM | POA: Diagnosis not present

## 2023-05-04 LAB — RAD ONC ARIA SESSION SUMMARY
Course Elapsed Days: 33
Plan Fractions Treated to Date: 24
Plan Prescribed Dose Per Fraction: 2 Gy
Plan Total Fractions Prescribed: 40
Plan Total Prescribed Dose: 80 Gy
Reference Point Dosage Given to Date: 48 Gy
Reference Point Session Dosage Given: 2 Gy
Session Number: 24

## 2023-05-05 ENCOUNTER — Ambulatory Visit
Admission: RE | Admit: 2023-05-05 | Discharge: 2023-05-05 | Disposition: A | Discharge status: Home / Self Care | Payer: Medicare HMO | Source: Ambulatory Visit | Attending: Radiation Oncology | Admitting: Radiation Oncology

## 2023-05-05 ENCOUNTER — Other Ambulatory Visit: Payer: Self-pay

## 2023-05-05 DIAGNOSIS — Z510 Encounter for antineoplastic radiation therapy: Secondary | ICD-10-CM | POA: Diagnosis not present

## 2023-05-05 LAB — RAD ONC ARIA SESSION SUMMARY
Course Elapsed Days: 34
Plan Fractions Treated to Date: 25
Plan Prescribed Dose Per Fraction: 2 Gy
Plan Total Fractions Prescribed: 40
Plan Total Prescribed Dose: 80 Gy
Reference Point Dosage Given to Date: 50 Gy
Reference Point Session Dosage Given: 2 Gy
Session Number: 25

## 2023-05-06 ENCOUNTER — Inpatient Hospital Stay: Payer: Medicare HMO

## 2023-05-06 ENCOUNTER — Other Ambulatory Visit: Payer: Self-pay

## 2023-05-06 ENCOUNTER — Ambulatory Visit
Admission: RE | Admit: 2023-05-06 | Discharge: 2023-05-06 | Disposition: A | Discharge status: Home / Self Care | Payer: Medicare HMO | Source: Ambulatory Visit | Attending: Radiation Oncology | Admitting: Radiation Oncology

## 2023-05-06 DIAGNOSIS — Z510 Encounter for antineoplastic radiation therapy: Secondary | ICD-10-CM | POA: Diagnosis not present

## 2023-05-06 LAB — RAD ONC ARIA SESSION SUMMARY
Course Elapsed Days: 35
Plan Fractions Treated to Date: 26
Plan Prescribed Dose Per Fraction: 2 Gy
Plan Total Fractions Prescribed: 40
Plan Total Prescribed Dose: 80 Gy
Reference Point Dosage Given to Date: 52 Gy
Reference Point Session Dosage Given: 2 Gy
Session Number: 26

## 2023-05-07 ENCOUNTER — Ambulatory Visit
Admission: RE | Admit: 2023-05-07 | Discharge: 2023-05-07 | Disposition: A | Discharge status: Home / Self Care | Payer: Medicare HMO | Source: Ambulatory Visit | Attending: Radiation Oncology | Admitting: Radiation Oncology

## 2023-05-07 ENCOUNTER — Other Ambulatory Visit: Payer: Self-pay

## 2023-05-07 DIAGNOSIS — Z510 Encounter for antineoplastic radiation therapy: Secondary | ICD-10-CM | POA: Diagnosis not present

## 2023-05-07 LAB — RAD ONC ARIA SESSION SUMMARY
Course Elapsed Days: 36
Plan Fractions Treated to Date: 27
Plan Prescribed Dose Per Fraction: 2 Gy
Plan Total Fractions Prescribed: 40
Plan Total Prescribed Dose: 80 Gy
Reference Point Dosage Given to Date: 54 Gy
Reference Point Session Dosage Given: 2 Gy
Session Number: 27

## 2023-05-08 ENCOUNTER — Ambulatory Visit
Admission: RE | Admit: 2023-05-08 | Discharge: 2023-05-08 | Disposition: A | Discharge status: Home / Self Care | Payer: Medicare HMO | Source: Ambulatory Visit | Attending: Radiation Oncology | Admitting: Radiation Oncology

## 2023-05-08 ENCOUNTER — Other Ambulatory Visit: Payer: Self-pay

## 2023-05-08 DIAGNOSIS — Z510 Encounter for antineoplastic radiation therapy: Secondary | ICD-10-CM | POA: Diagnosis not present

## 2023-05-08 LAB — RAD ONC ARIA SESSION SUMMARY
Course Elapsed Days: 37
Plan Fractions Treated to Date: 28
Plan Prescribed Dose Per Fraction: 2 Gy
Plan Total Fractions Prescribed: 40
Plan Total Prescribed Dose: 80 Gy
Reference Point Dosage Given to Date: 56 Gy
Reference Point Session Dosage Given: 2 Gy
Session Number: 28

## 2023-05-12 ENCOUNTER — Ambulatory Visit
Admission: RE | Admit: 2023-05-12 | Discharge: 2023-05-12 | Disposition: A | Discharge status: Home / Self Care | Payer: Medicare HMO | Source: Ambulatory Visit | Attending: Radiation Oncology | Admitting: Radiation Oncology

## 2023-05-12 ENCOUNTER — Other Ambulatory Visit: Payer: Self-pay

## 2023-05-12 DIAGNOSIS — Z510 Encounter for antineoplastic radiation therapy: Secondary | ICD-10-CM | POA: Diagnosis not present

## 2023-05-12 LAB — RAD ONC ARIA SESSION SUMMARY
Course Elapsed Days: 41
Plan Fractions Treated to Date: 29
Plan Prescribed Dose Per Fraction: 2 Gy
Plan Total Fractions Prescribed: 40
Plan Total Prescribed Dose: 80 Gy
Reference Point Dosage Given to Date: 58 Gy
Reference Point Session Dosage Given: 2 Gy
Session Number: 29

## 2023-05-13 ENCOUNTER — Ambulatory Visit
Admission: RE | Admit: 2023-05-13 | Discharge: 2023-05-13 | Disposition: A | Discharge status: Home / Self Care | Payer: Medicare HMO | Source: Ambulatory Visit | Attending: Radiation Oncology | Admitting: Radiation Oncology

## 2023-05-13 ENCOUNTER — Other Ambulatory Visit: Payer: Self-pay

## 2023-05-13 DIAGNOSIS — Z510 Encounter for antineoplastic radiation therapy: Secondary | ICD-10-CM | POA: Diagnosis not present

## 2023-05-13 LAB — RAD ONC ARIA SESSION SUMMARY
Course Elapsed Days: 42
Plan Fractions Treated to Date: 30
Plan Prescribed Dose Per Fraction: 2 Gy
Plan Total Fractions Prescribed: 40
Plan Total Prescribed Dose: 80 Gy
Reference Point Dosage Given to Date: 60 Gy
Reference Point Session Dosage Given: 2 Gy
Session Number: 30

## 2023-05-14 ENCOUNTER — Other Ambulatory Visit: Payer: Self-pay

## 2023-05-14 ENCOUNTER — Ambulatory Visit
Admission: RE | Admit: 2023-05-14 | Discharge: 2023-05-14 | Disposition: A | Discharge status: Home / Self Care | Payer: Medicare HMO | Source: Ambulatory Visit | Attending: Radiation Oncology | Admitting: Radiation Oncology

## 2023-05-14 DIAGNOSIS — Z510 Encounter for antineoplastic radiation therapy: Secondary | ICD-10-CM | POA: Diagnosis not present

## 2023-05-14 LAB — RAD ONC ARIA SESSION SUMMARY
Course Elapsed Days: 43
Plan Fractions Treated to Date: 31
Plan Prescribed Dose Per Fraction: 2 Gy
Plan Total Fractions Prescribed: 40
Plan Total Prescribed Dose: 80 Gy
Reference Point Dosage Given to Date: 62 Gy
Reference Point Session Dosage Given: 2 Gy
Session Number: 31

## 2023-05-15 ENCOUNTER — Ambulatory Visit
Admission: RE | Admit: 2023-05-15 | Discharge: 2023-05-15 | Disposition: A | Discharge status: Home / Self Care | Payer: Medicare HMO | Source: Ambulatory Visit | Attending: Radiation Oncology | Admitting: Radiation Oncology

## 2023-05-15 ENCOUNTER — Other Ambulatory Visit: Payer: Self-pay

## 2023-05-15 DIAGNOSIS — Z510 Encounter for antineoplastic radiation therapy: Secondary | ICD-10-CM | POA: Diagnosis not present

## 2023-05-15 LAB — RAD ONC ARIA SESSION SUMMARY
Course Elapsed Days: 44
Plan Fractions Treated to Date: 32
Plan Prescribed Dose Per Fraction: 2 Gy
Plan Total Fractions Prescribed: 40
Plan Total Prescribed Dose: 80 Gy
Reference Point Dosage Given to Date: 64 Gy
Reference Point Session Dosage Given: 2 Gy
Session Number: 32

## 2023-05-18 ENCOUNTER — Ambulatory Visit
Admission: RE | Admit: 2023-05-18 | Discharge: 2023-05-18 | Disposition: A | Discharge status: Home / Self Care | Payer: Medicare HMO | Source: Ambulatory Visit | Attending: Radiation Oncology | Admitting: Radiation Oncology

## 2023-05-18 ENCOUNTER — Other Ambulatory Visit: Payer: Self-pay

## 2023-05-18 DIAGNOSIS — Z510 Encounter for antineoplastic radiation therapy: Secondary | ICD-10-CM | POA: Diagnosis present

## 2023-05-18 DIAGNOSIS — C61 Malignant neoplasm of prostate: Secondary | ICD-10-CM | POA: Diagnosis present

## 2023-05-18 LAB — RAD ONC ARIA SESSION SUMMARY
Course Elapsed Days: 47
Plan Fractions Treated to Date: 33
Plan Prescribed Dose Per Fraction: 2 Gy
Plan Total Fractions Prescribed: 40
Plan Total Prescribed Dose: 80 Gy
Reference Point Dosage Given to Date: 66 Gy
Reference Point Session Dosage Given: 2 Gy
Session Number: 33

## 2023-05-19 ENCOUNTER — Other Ambulatory Visit: Payer: Self-pay

## 2023-05-19 ENCOUNTER — Ambulatory Visit
Admission: RE | Admit: 2023-05-19 | Discharge: 2023-05-19 | Disposition: A | Discharge status: Home / Self Care | Payer: Medicare HMO | Source: Ambulatory Visit | Attending: Radiation Oncology | Admitting: Radiation Oncology

## 2023-05-19 DIAGNOSIS — Z510 Encounter for antineoplastic radiation therapy: Secondary | ICD-10-CM | POA: Diagnosis not present

## 2023-05-19 LAB — RAD ONC ARIA SESSION SUMMARY
Course Elapsed Days: 48
Plan Fractions Treated to Date: 34
Plan Prescribed Dose Per Fraction: 2 Gy
Plan Total Fractions Prescribed: 40
Plan Total Prescribed Dose: 80 Gy
Reference Point Dosage Given to Date: 68 Gy
Reference Point Session Dosage Given: 2 Gy
Session Number: 34

## 2023-05-20 ENCOUNTER — Other Ambulatory Visit: Payer: Self-pay

## 2023-05-20 ENCOUNTER — Inpatient Hospital Stay: Payer: Medicare HMO | Attending: Radiation Oncology

## 2023-05-20 ENCOUNTER — Ambulatory Visit
Admission: RE | Admit: 2023-05-20 | Discharge: 2023-05-20 | Disposition: A | Discharge status: Home / Self Care | Payer: Medicare HMO | Source: Ambulatory Visit | Attending: Radiation Oncology | Admitting: Radiation Oncology

## 2023-05-20 DIAGNOSIS — Z510 Encounter for antineoplastic radiation therapy: Secondary | ICD-10-CM | POA: Diagnosis not present

## 2023-05-20 LAB — RAD ONC ARIA SESSION SUMMARY
Course Elapsed Days: 49
Plan Fractions Treated to Date: 35
Plan Prescribed Dose Per Fraction: 2 Gy
Plan Total Fractions Prescribed: 40
Plan Total Prescribed Dose: 80 Gy
Reference Point Dosage Given to Date: 70 Gy
Reference Point Session Dosage Given: 2 Gy
Session Number: 35

## 2023-05-21 ENCOUNTER — Other Ambulatory Visit: Payer: Self-pay

## 2023-05-21 ENCOUNTER — Ambulatory Visit
Admission: RE | Admit: 2023-05-21 | Discharge: 2023-05-21 | Disposition: A | Discharge status: Home / Self Care | Payer: Medicare HMO | Source: Ambulatory Visit | Attending: Radiation Oncology | Admitting: Radiation Oncology

## 2023-05-21 DIAGNOSIS — Z510 Encounter for antineoplastic radiation therapy: Secondary | ICD-10-CM | POA: Diagnosis not present

## 2023-05-21 LAB — RAD ONC ARIA SESSION SUMMARY
Course Elapsed Days: 50
Plan Fractions Treated to Date: 36
Plan Prescribed Dose Per Fraction: 2 Gy
Plan Total Fractions Prescribed: 40
Plan Total Prescribed Dose: 80 Gy
Reference Point Dosage Given to Date: 72 Gy
Reference Point Session Dosage Given: 2 Gy
Session Number: 36

## 2023-05-22 ENCOUNTER — Ambulatory Visit
Admission: RE | Admit: 2023-05-22 | Discharge: 2023-05-22 | Disposition: A | Discharge status: Home / Self Care | Payer: Medicare HMO | Source: Ambulatory Visit | Attending: Radiation Oncology | Admitting: Radiation Oncology

## 2023-05-22 ENCOUNTER — Other Ambulatory Visit: Payer: Self-pay

## 2023-05-22 DIAGNOSIS — Z510 Encounter for antineoplastic radiation therapy: Secondary | ICD-10-CM | POA: Diagnosis not present

## 2023-05-22 LAB — RAD ONC ARIA SESSION SUMMARY
Course Elapsed Days: 51
Plan Fractions Treated to Date: 37
Plan Prescribed Dose Per Fraction: 2 Gy
Plan Total Fractions Prescribed: 40
Plan Total Prescribed Dose: 80 Gy
Reference Point Dosage Given to Date: 74 Gy
Reference Point Session Dosage Given: 2 Gy
Session Number: 37

## 2023-05-25 ENCOUNTER — Ambulatory Visit
Admission: RE | Admit: 2023-05-25 | Discharge: 2023-05-25 | Disposition: A | Discharge status: Home / Self Care | Payer: Medicare HMO | Source: Ambulatory Visit | Attending: Radiation Oncology | Admitting: Radiation Oncology

## 2023-05-25 ENCOUNTER — Other Ambulatory Visit: Payer: Self-pay

## 2023-05-25 DIAGNOSIS — Z510 Encounter for antineoplastic radiation therapy: Secondary | ICD-10-CM | POA: Diagnosis not present

## 2023-05-25 LAB — RAD ONC ARIA SESSION SUMMARY
Course Elapsed Days: 54
Plan Fractions Treated to Date: 38
Plan Prescribed Dose Per Fraction: 2 Gy
Plan Total Fractions Prescribed: 40
Plan Total Prescribed Dose: 80 Gy
Reference Point Dosage Given to Date: 76 Gy
Reference Point Session Dosage Given: 2 Gy
Session Number: 38

## 2023-05-26 ENCOUNTER — Other Ambulatory Visit: Payer: Self-pay

## 2023-05-26 ENCOUNTER — Ambulatory Visit
Admission: RE | Admit: 2023-05-26 | Discharge: 2023-05-26 | Disposition: A | Discharge status: Home / Self Care | Payer: Medicare HMO | Source: Ambulatory Visit | Attending: Radiation Oncology | Admitting: Radiation Oncology

## 2023-05-26 DIAGNOSIS — Z510 Encounter for antineoplastic radiation therapy: Secondary | ICD-10-CM | POA: Diagnosis not present

## 2023-05-26 LAB — RAD ONC ARIA SESSION SUMMARY
Course Elapsed Days: 55
Plan Fractions Treated to Date: 39
Plan Prescribed Dose Per Fraction: 2 Gy
Plan Total Fractions Prescribed: 40
Plan Total Prescribed Dose: 80 Gy
Reference Point Dosage Given to Date: 78 Gy
Reference Point Session Dosage Given: 2 Gy
Session Number: 39

## 2023-05-27 ENCOUNTER — Other Ambulatory Visit: Payer: Self-pay

## 2023-05-27 ENCOUNTER — Ambulatory Visit
Admission: RE | Admit: 2023-05-27 | Discharge: 2023-05-27 | Disposition: A | Discharge status: Home / Self Care | Payer: Medicare HMO | Source: Ambulatory Visit | Attending: Radiation Oncology | Admitting: Radiation Oncology

## 2023-05-27 DIAGNOSIS — Z510 Encounter for antineoplastic radiation therapy: Secondary | ICD-10-CM | POA: Diagnosis not present

## 2023-05-27 LAB — RAD ONC ARIA SESSION SUMMARY
Course Elapsed Days: 56
Plan Fractions Treated to Date: 40
Plan Prescribed Dose Per Fraction: 2 Gy
Plan Total Fractions Prescribed: 40
Plan Total Prescribed Dose: 80 Gy
Reference Point Dosage Given to Date: 80 Gy
Reference Point Session Dosage Given: 2 Gy
Session Number: 40

## 2023-07-01 ENCOUNTER — Ambulatory Visit
Admission: RE | Admit: 2023-07-01 | Discharge: 2023-07-01 | Disposition: A | Discharge status: Home / Self Care | Payer: Medicare HMO | Source: Ambulatory Visit | Attending: Radiation Oncology | Admitting: Radiation Oncology

## 2023-07-01 ENCOUNTER — Other Ambulatory Visit: Payer: Self-pay | Admitting: *Deleted

## 2023-07-01 ENCOUNTER — Encounter: Payer: Self-pay | Admitting: Radiation Oncology

## 2023-07-01 VITALS — BP 123/70 | HR 72 | Temp 98.4°F | Resp 16 | Wt 124.0 lb

## 2023-07-01 DIAGNOSIS — C61 Malignant neoplasm of prostate: Secondary | ICD-10-CM

## 2023-07-01 NOTE — Progress Notes (Signed)
Radiation Oncology Follow up Note  Name: Andre Rios   Date:   07/01/2023 MRN:  [id number] DOB: 06-18-1951    This 72 y.o. male presents to the clinic today for 1 month follow-up status post image guided IMRT radiation therapy for cysts Gleason 7 (3+4) adenocarcinoma presenting with a PSA in the 15 range.  REFERRING PROVIDER: [facility]  HPI: Patient is a 72 year old male now out 1 month having completed IMRT image guided radiation therapy for stage IIb Gleason 7 adenocarcinoma the prostate seen today in routine follow-up his dysuria is markedly improved.  Still some slight dysuria.  I have reminded him to drink as much cranberry juice as possible.  Bowel function is fine..  COMPLICATIONS OF TREATMENT: none  FOLLOW UP COMPLIANCE: keeps appointments   PHYSICAL EXAM:  BP 123/70   Pulse 72   Temp 98.4 F (36.9 C) (Tympanic)   Resp 16   Wt 124 lb (56.2 kg)   BMI 20.01 kg/m  Well-developed well-nourished patient in NAD. HEENT reveals PERLA, EOMI, discs not visualized.  Oral cavity is clear. No oral mucosal lesions are identified. Neck is clear without evidence of cervical or supraclavicular adenopathy. Lungs are clear to A&P. Cardiac examination is essentially unremarkable with regular rate and rhythm without murmur rub or thrill. Abdomen is benign with no organomegaly or masses noted. Motor sensory and DTR levels are equal and symmetric in the upper and lower extremities. Cranial nerves II through XII are grossly intact. Proprioception is intact. No peripheral adenopathy or edema is identified. No motor or sensory levels are noted. Crude visual fields are within normal range.  RADIOLOGY RESULTS: No current films for review  PLAN: Present time patient is doing well with low side effect profile 1 month out from external beam radiation therapy.  On pleased with his overall progress again encouraged him to drink as much cranberry juice as possible.  Will see him back in 3 months  with a repeat PSA.  Patient is to call sooner with any concerns.  I would like to take this opportunity to thank you for allowing me to participate in the care of your patient.Carmina Miller, MD

## 2023-10-01 ENCOUNTER — Inpatient Hospital Stay: Payer: Medicare HMO | Attending: Radiation Oncology

## 2023-10-01 DIAGNOSIS — C61 Malignant neoplasm of prostate: Secondary | ICD-10-CM | POA: Insufficient documentation

## 2023-10-01 LAB — CBC (CANCER CENTER ONLY)
HCT: 41.8 % (ref 39.0–52.0)
Hemoglobin: 13.5 g/dL (ref 13.0–17.0)
MCH: 28.1 pg (ref 26.0–34.0)
MCHC: 32.3 g/dL (ref 30.0–36.0)
MCV: 87.1 fL (ref 80.0–100.0)
Platelet Count: 222 10*3/uL (ref 150–400)
RBC: 4.8 MIL/uL (ref 4.22–5.81)
RDW: 12.7 % (ref 11.5–15.5)
WBC Count: 5.4 10*3/uL (ref 4.0–10.5)
nRBC: 0 % (ref 0.0–0.2)

## 2023-10-01 LAB — PSA: Prostatic Specific Antigen: <0.01 ng/mL (ref 0.00–4.00)

## 2023-10-05 ENCOUNTER — Ambulatory Visit: Payer: Medicare HMO | Admitting: Radiation Oncology

## 2023-10-07 ENCOUNTER — Ambulatory Visit
Admission: RE | Admit: 2023-10-07 | Discharge: 2023-10-07 | Disposition: A | Discharge status: Home / Self Care | Payer: Medicare HMO | Source: Ambulatory Visit | Attending: Radiation Oncology | Admitting: Radiation Oncology

## 2023-10-07 ENCOUNTER — Encounter: Payer: Self-pay | Admitting: Radiation Oncology

## 2023-10-07 VITALS — BP 115/73 | HR 92 | Temp 98.3°F | Resp 16 | Wt 129.3 lb

## 2023-10-07 DIAGNOSIS — C61 Malignant neoplasm of prostate: Secondary | ICD-10-CM | POA: Diagnosis present

## 2023-10-07 DIAGNOSIS — Z923 Personal history of irradiation: Secondary | ICD-10-CM | POA: Insufficient documentation

## 2023-10-07 NOTE — Progress Notes (Signed)
Radiation Oncology Follow up Note  Name: Andre Rios   Date:   10/07/2023 MRN:  [id number] DOB: 1951/03/28    This 72 y.o. male presents to the clinic today for 87-month follow-up status post IMRT radiation therapy for Gleason 7 (3+4) adenocarcinoma presenting with a PSA in the 15 range.  REFERRING PROVIDER: [facility]  HPI: Patient is a 72 year old male now out for months having completed IMRT radiation therapy to his prostate for Gleason 7 (3+4) adenocarcinoma presented with a PSA in the 15 range.  Seen today in routine follow-up he is doing well.  Does occasionally have some dysuria particularly at night.  No diarrhea.  His most recent PSA is 0.01 showing excellent response to treatment.  COMPLICATIONS OF TREATMENT: none  FOLLOW UP COMPLIANCE: keeps appointments   PHYSICAL EXAM:  BP 115/73   Pulse 92   Temp 98.3 F (36.8 C) (Tympanic)   Resp 16   Wt 129 lb 4.8 oz (58.7 kg)   BMI 20.87 kg/m  Well-developed well-nourished patient in NAD. HEENT reveals PERLA, EOMI, discs not visualized.  Oral cavity is clear. No oral mucosal lesions are identified. Neck is clear without evidence of cervical or supraclavicular adenopathy. Lungs are clear to A&P. Cardiac examination is essentially unremarkable with regular rate and rhythm without murmur rub or thrill. Abdomen is benign with no organomegaly or masses noted. Motor sensory and DTR levels are equal and symmetric in the upper and lower extremities. Cranial nerves II through XII are grossly intact. Proprioception is intact. No peripheral adenopathy or edema is identified. No motor or sensory levels are noted. Crude visual fields are within normal range.  RADIOLOGY RESULTS: No current films for review  PLAN: Prostate Cancer (Gleason 7, 3+4) PSA is 0.01 indicating complete remission after IMRT radiation therapy. -Return in six months with a new PSA test prior to the visit.  Urinary Symptoms Reports occasional difficulty and  mild burning during urination, particularly at night. No severe pain or other complications. -Continue current management strategies, including drinking cranberry juice and green tea.  Bowel Function Normal, no issues reported. -Continue current bowel regimen.    Carmina Miller, MD

## 2024-02-17 ENCOUNTER — Other Ambulatory Visit: Payer: Self-pay

## 2024-02-17 DIAGNOSIS — R399 Unspecified symptoms and signs involving the genitourinary system: Secondary | ICD-10-CM

## 2024-02-17 DIAGNOSIS — C61 Malignant neoplasm of prostate: Secondary | ICD-10-CM

## 2024-02-17 MED ORDER — TAMSULOSIN HCL 0.4 MG PO CAPS: 0.4000 mg | ORAL_CAPSULE | Freq: Every day | ORAL | 11 refills | Status: AC

## 2024-03-07 ENCOUNTER — Encounter: Payer: Self-pay | Admitting: Radiation Oncology

## 2024-03-07 ENCOUNTER — Inpatient Hospital Stay: Attending: Radiation Oncology

## 2024-03-07 ENCOUNTER — Ambulatory Visit
Admission: RE | Admit: 2024-03-07 | Discharge: 2024-03-07 | Disposition: A | Discharge status: Home / Self Care | Payer: Medicare HMO | Source: Ambulatory Visit | Attending: Radiation Oncology | Admitting: Radiation Oncology

## 2024-03-07 ENCOUNTER — Other Ambulatory Visit: Payer: Self-pay | Admitting: *Deleted

## 2024-03-07 VITALS — BP 140/85 | HR 68 | Temp 98.7°F | Resp 19 | Wt 134.8 lb

## 2024-03-07 DIAGNOSIS — Z923 Personal history of irradiation: Secondary | ICD-10-CM | POA: Insufficient documentation

## 2024-03-07 DIAGNOSIS — C61 Malignant neoplasm of prostate: Secondary | ICD-10-CM | POA: Insufficient documentation

## 2024-03-07 DIAGNOSIS — R351 Nocturia: Secondary | ICD-10-CM | POA: Insufficient documentation

## 2024-03-07 LAB — CBC (CANCER CENTER ONLY)
HCT: 45.2 % (ref 39.0–52.0)
Hemoglobin: 14.1 g/dL (ref 13.0–17.0)
MCH: 27.3 pg (ref 26.0–34.0)
MCHC: 31.2 g/dL (ref 30.0–36.0)
MCV: 87.6 fL (ref 80.0–100.0)
Platelet Count: 167 10*3/uL (ref 150–400)
RBC: 5.16 MIL/uL (ref 4.22–5.81)
RDW: 13.3 % (ref 11.5–15.5)
WBC Count: 5.3 10*3/uL (ref 4.0–10.5)
nRBC: 0 % (ref 0.0–0.2)

## 2024-03-07 LAB — PSA: Prostatic Specific Antigen: 0.04 ng/mL (ref 0.00–4.00)

## 2024-03-07 NOTE — Progress Notes (Signed)
 Radiation Oncology Follow up Note  Name: Andre Rios   Date:   03/07/2024 MRN:  [id number] DOB: [birth year]    This 73 y.o. male presents to the clinic today for 64-month follow-up status post IMRT radiation therapy for Gleason 7 (3+4) adenocarcinoma presenting with a PSA in the 15 range.  REFERRING PROVIDER: [facility]  HPI: Patient is a 73 year old male now out 10 months having completed IMRT radiation therapy for Gleason 7 adenocarcinoma the prostate.  Seen today in routine follow-up he is doing well.  Still has occasional slight stinging on urination in the morning otherwise doing fine he has nocturia x 2.  Is having his PSA drawn today.Marland Kitchen  He is come off his Flomax.  He is also stopped drinking cranberry juice  COMPLICATIONS OF TREATMENT: none  FOLLOW UP COMPLIANCE: keeps appointments   PHYSICAL EXAM:  BP (!) 140/85   Pulse 68   Temp 98.7 F (37.1 C)   Resp 19   Wt 134 lb 12.8 oz (61.1 kg)   SpO2 99%   BMI 21.76 kg/m  Well-developed well-nourished patient in NAD. HEENT reveals PERLA, EOMI, discs not visualized.  Oral cavity is clear. No oral mucosal lesions are identified. Neck is clear without evidence of cervical or supraclavicular adenopathy. Lungs are clear to A&P. Cardiac examination is essentially unremarkable with regular rate and rhythm without murmur rub or thrill. Abdomen is benign with no organomegaly or masses noted. Motor sensory and DTR levels are equal and symmetric in the upper and lower extremities. Cranial nerves II through XII are grossly intact. Proprioception is intact. No peripheral adenopathy or edema is identified. No motor or sensory levels are noted. Crude visual fields are within normal range.  RADIOLOGY RESULTS: No current films to review  PLAN: Present time he is doing well I have asked him to resume drinking as much cranberry juice as possible.  I have instructed him he probably can do without the Flomax since he is only having nocturia  x 2.  He will have his PSA drawn today have asked to see him back in 6 months for repeat PSA and follow-up.  Patient knows to call with any concerns.  I would like to take this opportunity to thank you for allowing me to participate in the care of your patient.Carmina Miller, MD

## 2024-03-30 ENCOUNTER — Other Ambulatory Visit: Payer: Medicare HMO

## 2024-09-01 ENCOUNTER — Inpatient Hospital Stay

## 2024-09-01 DIAGNOSIS — C61 Malignant neoplasm of prostate: Secondary | ICD-10-CM | POA: Insufficient documentation

## 2024-09-08 ENCOUNTER — Inpatient Hospital Stay

## 2024-09-08 ENCOUNTER — Encounter: Payer: Self-pay | Admitting: Radiation Oncology

## 2024-09-08 ENCOUNTER — Ambulatory Visit
Admission: RE | Admit: 2024-09-08 | Discharge: 2024-09-08 | Disposition: A | Discharge status: Home / Self Care | Source: Ambulatory Visit | Attending: Radiation Oncology | Admitting: Radiation Oncology

## 2024-09-08 ENCOUNTER — Other Ambulatory Visit: Payer: Self-pay | Admitting: *Deleted

## 2024-09-08 VITALS — BP 131/82 | HR 77 | Temp 97.4°F | Resp 16 | Wt 133.0 lb

## 2024-09-08 DIAGNOSIS — C61 Malignant neoplasm of prostate: Secondary | ICD-10-CM

## 2024-09-08 DIAGNOSIS — Z923 Personal history of irradiation: Secondary | ICD-10-CM | POA: Diagnosis not present

## 2024-09-08 LAB — PSA: Prostatic Specific Antigen: <0.02 ng/mL (ref 0.00–4.00)

## 2024-09-08 NOTE — Progress Notes (Signed)
 Radiation Oncology Follow up Note  Name: Andre Rios   Date:   09/08/2024 MRN:  [id number] DOB: [birth year]    This 73 y.o. male presents to the clinic today for 35-month follow-up status post image guided radiation therapy for Gleason 7 adenocarcinoma the prostate presenting with a PSA in the 15 range.  REFERRING PROVIDER: [facility]  HPI: Patient is a 73 year old male now out 16 months having completed IMRT radiation therapy for Gleason 7 adenocarcinoma the prostate.  Seen today in routine follow-up he does have some nocturia and slightly poor stream he has been taking Flomax  in the morning I have suggested he starts taking it after dinner at night.  He specifically denies any diarrhea.  He is having his PSA tested today..  COMPLICATIONS OF TREATMENT: none  FOLLOW UP COMPLIANCE: keeps appointments   PHYSICAL EXAM:  BP 131/82   Pulse 77   Temp (!) 97.4 F (36.3 C)   Resp 16   Wt 133 lb (60.3 kg)   BMI 21.47 kg/m  Well-developed well-nourished patient in NAD. HEENT reveals PERLA, EOMI, discs not visualized.  Oral cavity is clear. No oral mucosal lesions are identified. Neck is clear without evidence of cervical or supraclavicular adenopathy. Lungs are clear to A&P. Cardiac examination is essentially unremarkable with regular rate and rhythm without murmur rub or thrill. Abdomen is benign with no organomegaly or masses noted. Motor sensory and DTR levels are equal and symmetric in the upper and lower extremities. Cranial nerves II through XII are grossly intact. Proprioception is intact. No peripheral adenopathy or edema is identified. No motor or sensory levels are noted. Crude visual fields are within normal range.  RADIOLOGY RESULTS: No current films for review  PLAN: Present time patient clinically is doing well he will switch his Flomax  to the evening and let us  know how that is working.  Otherwise he is having his PSA tested today we will report that separately.  I  have asked to see him back in 1 year for follow-up.  I would like to take this opportunity to thank you for allowing me to participate in the care of your patient.SABRA Marcey Penton, MD

## 2025-08-31 ENCOUNTER — Other Ambulatory Visit

## 2025-09-07 ENCOUNTER — Ambulatory Visit: Admitting: Radiation Oncology
